# Patient Record
Sex: Female | Born: 1970
Health system: Southern US, Community
[De-identification: ages and names within clinical notes are randomized; demographics above are authoritative.]

## PROBLEM LIST (undated history)

## (undated) DIAGNOSIS — K219 Gastro-esophageal reflux disease without esophagitis: Secondary | ICD-10-CM

## (undated) HISTORY — DX: Gastro-esophageal reflux disease without esophagitis: K21.9

---

## 2004-01-09 ENCOUNTER — Ambulatory Visit: Payer: Self-pay | Admitting: Family Medicine

## 2016-01-20 DIAGNOSIS — Z0000 Encounter for general adult medical examination without abnormal findings: Secondary | ICD-10-CM | POA: Diagnosis not present

## 2016-01-20 DIAGNOSIS — D229 Melanocytic nevi, unspecified: Secondary | ICD-10-CM | POA: Diagnosis not present

## 2016-01-20 DIAGNOSIS — Z1389 Encounter for screening for other disorder: Secondary | ICD-10-CM | POA: Diagnosis not present

## 2016-01-20 DIAGNOSIS — M549 Dorsalgia, unspecified: Secondary | ICD-10-CM | POA: Diagnosis not present

## 2016-01-20 DIAGNOSIS — R51 Headache: Secondary | ICD-10-CM | POA: Diagnosis not present

## 2016-03-09 DIAGNOSIS — Z0000 Encounter for general adult medical examination without abnormal findings: Secondary | ICD-10-CM | POA: Diagnosis not present

## 2016-03-09 DIAGNOSIS — Z124 Encounter for screening for malignant neoplasm of cervix: Secondary | ICD-10-CM | POA: Diagnosis not present

## 2016-03-15 DIAGNOSIS — J111 Influenza due to unidentified influenza virus with other respiratory manifestations: Secondary | ICD-10-CM | POA: Diagnosis not present

## 2016-06-22 DIAGNOSIS — H1012 Acute atopic conjunctivitis, left eye: Secondary | ICD-10-CM | POA: Diagnosis not present

## 2016-06-22 DIAGNOSIS — L659 Nonscarring hair loss, unspecified: Secondary | ICD-10-CM | POA: Diagnosis not present

## 2016-06-22 DIAGNOSIS — M79671 Pain in right foot: Secondary | ICD-10-CM | POA: Diagnosis not present

## 2016-08-12 DIAGNOSIS — H5203 Hypermetropia, bilateral: Secondary | ICD-10-CM | POA: Diagnosis not present

## 2016-10-19 DIAGNOSIS — D239 Other benign neoplasm of skin, unspecified: Secondary | ICD-10-CM | POA: Diagnosis not present

## 2016-10-19 DIAGNOSIS — Z1389 Encounter for screening for other disorder: Secondary | ICD-10-CM | POA: Diagnosis not present

## 2016-11-11 DIAGNOSIS — L308 Other specified dermatitis: Secondary | ICD-10-CM | POA: Diagnosis not present

## 2016-11-11 DIAGNOSIS — D229 Melanocytic nevi, unspecified: Secondary | ICD-10-CM | POA: Diagnosis not present

## 2016-11-11 DIAGNOSIS — D239 Other benign neoplasm of skin, unspecified: Secondary | ICD-10-CM | POA: Diagnosis not present

## 2016-11-11 DIAGNOSIS — I8393 Asymptomatic varicose veins of bilateral lower extremities: Secondary | ICD-10-CM | POA: Diagnosis not present

## 2016-11-15 DIAGNOSIS — Z1389 Encounter for screening for other disorder: Secondary | ICD-10-CM | POA: Diagnosis not present

## 2016-11-15 DIAGNOSIS — N390 Urinary tract infection, site not specified: Secondary | ICD-10-CM | POA: Diagnosis not present

## 2016-12-08 ENCOUNTER — Ambulatory Visit (INDEPENDENT_AMBULATORY_CARE_PROVIDER_SITE_OTHER): Payer: BLUE CROSS/BLUE SHIELD | Admitting: Advanced Practice Midwife

## 2016-12-08 ENCOUNTER — Encounter: Payer: Self-pay | Admitting: Advanced Practice Midwife

## 2016-12-08 VITALS — BP 128/74 | HR 67 | Temp 99.5°F | Ht 63.0 in | Wt 138.0 lb

## 2016-12-08 DIAGNOSIS — R3915 Urgency of urination: Secondary | ICD-10-CM | POA: Diagnosis not present

## 2016-12-08 DIAGNOSIS — R319 Hematuria, unspecified: Secondary | ICD-10-CM

## 2016-12-08 DIAGNOSIS — R309 Painful micturition, unspecified: Secondary | ICD-10-CM | POA: Diagnosis not present

## 2016-12-08 LAB — POCT URINALYSIS DIPSTICK
Bilirubin, UA: NEGATIVE
GLUCOSE UA: NEGATIVE
Ketones, UA: NEGATIVE
Leukocytes, UA: NEGATIVE
Nitrite, UA: NEGATIVE
SPEC GRAV UA: 1.01 (ref 1.010–1.025)
UROBILINOGEN UA: NEGATIVE U/dL — AB
pH, UA: 6 (ref 5.0–8.0)

## 2016-12-08 NOTE — Progress Notes (Signed)
S: The patient is here today with complaint of urinary urgency and low pelvic pain after she pees. She began having symptoms about 1 month ago. She was seen by another provider and was prescribed some medicine but she does not know what the medicine was. She took it for 3 days and did not feel relief. She then took it for 3 more days. There was some connection to her cycle but that part of the history is unclear. She is having a monthly cycle with some irregularity noted in length of cycles. About 3 days ago the urgency of urination increased. She is able to fully empty her bladder. She denies any itching, discharge or irritation. She has not had intercourse for about 1 month because she is afraid of the pain she has had. She is also complaining of pain in her lower back especially when lifting something heavy, and pain/heat in her right ankle. She denies flank pain.  Discussion of hormonal changes as she approaches menopause, pelvic floor muscle changes, vaginal dryness, pain with intercourse and using lubricant for comfort.  Discussion of sciatica nerve pain, comfort measures and type of doctor to see if the pain worsens.  Discussion of UTI s/s and that UA appears normal but we will send specimen for culture and I will let her know the results prior to starting any antibiotics. She can take tylenol or ibuprofen for pain.   O: Vital Signs: BP 128/74 (BP Location: Left Arm, Patient Position: Sitting, Cuff Size: Normal)   Pulse 67   Temp 99.5 F (37.5 C)   Ht 5\' 3"  (1.6 m)   Wt 138 lb (62.6 kg)   LMP 11/17/2016   BMI 24.45 kg/m  Constitutional: Well nourished, well developed female in no acute distress.  HEENT: normal Skin: Warm and dry.  Cardiovascular: Regular rate and rhythm.   Extremity: no edema Respiratory: Clear to auscultation bilateral. Normal respiratory effort Abdomen: soft, nondistended, no abnormal masses, no epigastric pain, mildly tender to palpation lower abdomen Back: no CVAT,  non-tender to palpation Neuro: DTRs 2+, Cranial nerves grossly intact Psych: Alert and Oriented x3. No memory deficits. Normal mood and affect.  MS: normal gait, normal bilateral lower extremity ROM/strength/stability.  Results for Tanya Sawyer, Tanya Sawyer (MRN [id number]) as of 12/08/2016 11:08  Ref. Range 12/08/2016 10:26  Bilirubin, UA Unknown Neg  Clarity, UA Unknown Clear  Color, UA Unknown Gold  Glucose Unknown Neg  Ketones, UA Unknown Neg  Leukocytes, UA Latest Ref Range: Negative  Negative  Nitrite, UA Unknown Neg  pH, UA Latest Ref Range: 5.0 - 8.0  6.0  Protein, UA Unknown 1+  Specific Gravity, UA Latest Ref Range: 1.010 - 1.025  1.010  Urobilinogen, UA Latest Ref Range: 0.2 or 1.0 E.U./dL negative (A)  RBC, UA Unknown Large    Pelvic exam:  is not limited by body habitus EGBUS: within normal limits Vagina: within normal limits and with normal mucosa, no evidence of organ prolapse Cervix: appears normal  A: 46 yo female with urinary urgency, pain with urination x 3 days, low back pain, right ankle pain  P: Urine Cx sent Recommendation for follow up care with chiropractor, orthopedic doctor as needed, comfort measures for back pain Comfort measures for perimenopausal hormonal changes  Tresea MallJane Xaine Sansom, CNM

## 2016-12-10 LAB — URINE CULTURE: Organism ID, Bacteria: NO GROWTH

## 2017-09-14 ENCOUNTER — Encounter: Payer: Self-pay | Admitting: Family Medicine

## 2017-09-14 ENCOUNTER — Ambulatory Visit (INDEPENDENT_AMBULATORY_CARE_PROVIDER_SITE_OTHER): Payer: BLUE CROSS/BLUE SHIELD | Admitting: Family Medicine

## 2017-09-14 VITALS — BP 114/72 | HR 84 | Temp 98.9°F | Resp 16 | Ht 61.0 in | Wt 138.6 lb

## 2017-09-14 DIAGNOSIS — K219 Gastro-esophageal reflux disease without esophagitis: Secondary | ICD-10-CM | POA: Insufficient documentation

## 2017-09-14 DIAGNOSIS — I83899 Varicose veins of unspecified lower extremities with other complications: Secondary | ICD-10-CM | POA: Diagnosis not present

## 2017-09-14 DIAGNOSIS — Z1239 Encounter for other screening for malignant neoplasm of breast: Secondary | ICD-10-CM

## 2017-09-14 DIAGNOSIS — Z1231 Encounter for screening mammogram for malignant neoplasm of breast: Secondary | ICD-10-CM

## 2017-09-14 DIAGNOSIS — Z23 Encounter for immunization: Secondary | ICD-10-CM | POA: Diagnosis not present

## 2017-09-14 DIAGNOSIS — F341 Dysthymic disorder: Secondary | ICD-10-CM

## 2017-09-14 DIAGNOSIS — M2352 Chronic instability of knee, left knee: Secondary | ICD-10-CM

## 2017-09-14 NOTE — Patient Instructions (Addendum)
Gastroesophageal Reflux Disease, Adult Normally, food travels down the esophagus and stays in the stomach to be digested. If a person has gastroesophageal reflux disease (GERD), food and stomach acid move back up into the esophagus. When this happens, the esophagus becomes sore and swollen (inflamed). Over time, GERD can make small holes (ulcers) in the lining of the esophagus. Follow these instructions at home: Diet  Follow a diet as told by your doctor. You may need to avoid foods and drinks such as: ? Coffee and tea (with or without caffeine). ? Drinks that contain alcohol. ? Energy drinks and sports drinks. ? Carbonated drinks or sodas. ? Chocolate and cocoa. ? Peppermint and mint flavorings. ? Garlic and onions. ? Horseradish. ? Spicy and acidic foods, such as peppers, chili powder, curry powder, vinegar, hot sauces, and BBQ sauce. ? Citrus fruit juices and citrus fruits, such as oranges, lemons, and limes. ? Tomato-based foods, such as red sauce, chili, salsa, and pizza with red sauce. ? Fried and fatty foods, such as donuts, french fries, potato chips, and high-fat dressings. ? High-fat meats, such as hot dogs, rib eye steak, sausage, ham, and bacon. ? High-fat dairy items, such as whole milk, butter, and cream cheese.  Eat small meals often. Avoid eating large meals.  Avoid drinking large amounts of liquid with your meals.  Avoid eating meals during the 2-3 hours before bedtime.  Avoid lying down right after you eat.  Do not exercise right after you eat. General instructions  Pay attention to any changes in your symptoms.  Take over-the-counter and prescription medicines only as told by your doctor. Do not take aspirin, ibuprofen, or other NSAIDs unless your doctor says it is okay.  Do not use any tobacco products, including cigarettes, chewing tobacco, and e-cigarettes. If you need help quitting, ask your doctor.  Wear loose clothes. Do not wear anything tight around  your waist.  Raise (elevate) the head of your bed about 6 inches (15 cm).  Try to lower your stress. If you need help doing this, ask your doctor.  If you are overweight, lose an amount of weight that is healthy for you. Ask your doctor about a safe weight loss goal.  Keep all follow-up visits as told by your doctor. This is important. Contact a doctor if:  You have new symptoms.  You lose weight and you do not know why it is happening.  You have trouble swallowing, or it hurts to swallow.  You have wheezing or a cough that keeps happening.  Your symptoms do not get better with treatment.  You have a hoarse voice. Get help right away if:  You have pain in your arms, neck, jaw, teeth, or back.  You feel sweaty, dizzy, or light-headed.  You have chest pain or shortness of breath.  You throw up (vomit) and your throw up looks like blood or coffee grounds.  You pass out (faint).  Your poop (stool) is bloody or black.  You cannot swallow, drink, or eat. This information is not intended to replace advice given to you by your health care provider. Make sure you discuss any questions you have with your health care provider. Document Released: 07/06/2007 Document Revised: 06/25/2015 Document Reviewed: 05/14/2014 Elsevier Interactive Patient Education  2018 [employer]  Knee Rehabilitation Guidelines Following Surgery After knee surgery, it is important to follow instructions from your health care provider about range-of-motion (ROM) and muscle strengthening exercises. This will improve your surgery results. If the exercises cause  you to have pain or swelling in your knee joint, do them less often until you can do them without pain. Then, slowly increase how often you do your exercises. If you have problems or questions, talk with your health care provider or physical therapist. You should start exercising as soon as your health care provider or physical therapist says it is  okay. Follow these instructions at home: Activity  Use your crutches orwalker as told by your health care provider.  Do not lift anything that is heavier than 10 lb (4.5 kg) and do not play contact sports until your health care provider says it is okay.  Return to your normal activities as told by your health care provider. Ask your health care provider what activities are safe for you.  Return to work as told by your health care provider.  Do not drive a car for six weeks or as told by your health care provider. General instructions  Take over-the-counter and prescription medicines only as told by your health care provider.  Protect your knee during the recovery period to keep it from getting injured again.  You may take sponge baths. Do not take showers or tub baths until your health care provider says it is okay.  Remove throw rugs and tripping hazards from the floor.  Wear elastic stockings for as long as your health care provider instructs you to.  Keep all follow-up visits as told by your health care provider. This is important. Range of motion and strengthening exercises Do your exercises as told by your health care provider or physical therapist. Before you exercise  Put a towel between your thigh and a heat pack or heating pad.  Leave the heat on your thigh muscle for 20-30 minutes before you exercise. Leg lifts While your knee is still in a splint or a cast, you can do straight-leg raises. Repeat this exercise 10-20 times, 2-3 times per day. As your knee gets better, do this exercise against resistance. 1. Lie flat on your back. 2. Lift the leg about 6 inches. Keep it raised for 3 seconds. 3. Slowly lower the leg.  Quad sets Repeat this exercise 10-20 times every hour. 1. Lie flat on your back. 2. Tighten your thigh muscle (quad). 3. Keep the muscle tight for 5-10 seconds.  Hamstring sets Repeat this exercise 10-20 times every hour. 1. Push your foot backward  against an object that does not move. 2. Keep pushing your foot against it for 5-10 seconds.  Weight-resistance exercises Weight-resistance exercises are another important part of rehabilitation. These exercises strengthen your muscles by making them work against resistance. Examples include using:  Free weights.  Weight-lifting machines.  Resistance bands.  Aerobic exercises Aerobic exercise keeps joints and muscles moving. It involves large muscle groups. It is also rhythmic in nature and is done for a longer period. Doing these exercises improves circulation and endurance. Your health care provider may have you start by taking a 20-30 minute walk, 2 times per day. Examples of aerobic exercise include:  Swimming.  Walking.  Hiking.  Jogging.  Cross-country skiing.  Bike riding.  This information is not intended to replace advice given to you by your health care provider. Make sure you discuss any questions you have with your health care provider. Document Released: 01/17/2005 Document Revised: 09/22/2015 Document Reviewed: 01/13/2014 Elsevier Interactive Patient Education  [employer]2018 [employer]

## 2017-09-14 NOTE — Progress Notes (Signed)
Name: Tanya Sawyer   MRN: [id number]    DOB: 1970-07-30   Date:09/14/2017       Progress Note  Subjective  Chief Complaint  Chief Complaint  Patient presents with  . Establish Care  . Knee Pain    Left Knee is bothering her and feels like it is giving out on her.    HPI  GERD: she states she has recurrent epigastric pain, burning like , aggravated by spicy food and better with a bland diet. Not associated with nausea or vomiting  Spider Vein: she states her legs aches at the end of the day, also has some itching and burning on her legs at times and would like to be checked.   Left knee instability: she states that over the past month she has noticed that left knee seems to give out. No falls, it happens a few times a week, no effusion of knee and pain is mild and aching when present. She had a fall but the pain caused by the fall was on the right knee and it happened about 2 years go.   Depression: she has a long history of depression, never affected her life or ability to function, she never took medication, she states when she feels down , she finds something to do and she feels better. Denies suicidal thoughts or ideation  Patient Active Problem List   Diagnosis Date Noted  . Symptomatic spider varicose vein 09/14/2017  . GERD (gastroesophageal reflux disease) 09/14/2017    Past Surgical History:  Procedure Laterality Date  . CESAREAN SECTION  1994, 1998    Family History  Problem Relation Age of Onset  . Neuropathy Father   . Hip fracture Paternal Grandmother   . Breast cancer Paternal Aunt     Social History   Socioeconomic History  . Marital status: Married    Spouse name: Gulio  . Number of children: 2  . Years of education: Not on file  . Highest education level: High school graduate  Occupational History  . Not on file  Social Needs  . Financial resource strain: Not hard at all  . Food insecurity:    Worry: Never true    Inability: Never true  .  Transportation needs:    Medical: No    Non-medical: No  Tobacco Use  . Smoking status: Never Smoker  . Smokeless tobacco: Never Used  Substance and Sexual Activity  . Alcohol use: No    Frequency: Never  . Drug use: No  . Sexual activity: Yes    Partners: Male    Birth control/protection: None  Lifestyle  . Physical activity:    Days per week: 0 days    Minutes per session: 0 min  . Stress: Only a little  Relationships  . Social connections:    Talks on phone: Not on file    Gets together: Not on file    Attends religious service: Not on file    Active member of club or organization: Not on file    Attends meetings of clubs or organizations: Not on file    Relationship status: Not on file  . Intimate partner violence:    Fear of current or ex partner: No    Emotionally abused: No    Physically abused: No    Forced sexual activity: No  Other Topics Concern  . Not on file  Social History Narrative   Married, she has two grown children that are still at home but  they work   She is originally from [place], she moved here at age 36 by herself, for a better life She met her husband here, he is also from [place]      Current Outpatient Medications:  Marland Kitchen  Multiple Vitamins-Minerals (MULTI-VITAMIN GUMMIES) CHEW, Chew 1 each by mouth daily. Olly Gummy Multi-Vitamins, Disp: , Rfl:  .  triamcinolone ointment (KENALOG) 0.1 %, , Disp: , Rfl:   Allergies  Allergen Reactions  . Penicillins     As a child     ROS  Constitutional: Negative for fever or weight change.  Respiratory: Negative for cough and shortness of breath.   Cardiovascular: Negative for chest pain or palpitations.  Gastrointestinal: Negative for abdominal pain, no bowel changes.  Musculoskeletal: Positive for intermittent  gait problem, left knee instability  or joint swelling.  Skin: Negative for rash.  Neurological: Negative for dizziness or headache.  No other specific complaints in a complete  review of systems (except as listed in HPI above).  Objective  Vitals:   09/14/17 1039  BP: 114/72  Pulse: 84  Resp: 16  Temp: 98.9 F (37.2 C)  TempSrc: Oral  SpO2: 98%  Weight: 138 lb 9.6 oz (62.9 kg)  Height: 5' 1" (1.549 m)    Body mass index is 26.19 kg/m.  Physical Exam  Constitutional: Patient appears well-developed and well-nourished. Overweight. No distress.  HEENT: head atraumatic, normocephalic, pupils equal and reactive to light,  neck supple, throat within normal limits Cardiovascular: Normal rate, regular rhythm and normal heart sounds.  No murmur heard. No BLE edema. She has spider veins both legs  Muscular skeletal: crepitus with extension of both knees, no effusion, normal rom  Pulmonary/Chest: Effort normal and breath sounds normal. No respiratory distress. Abdominal: Soft.  There is no tenderness. Psychiatric: Patient has a normal mood and affect. behavior is normal. Judgment and thought content normal.  PHQ2/9: Depression screen PHQ 2/9 09/14/2017  Decreased Interest 2  Down, Depressed, Hopeless 2  PHQ - 2 Score 4  Altered sleeping 1  Tired, decreased energy 1  Change in appetite 0  Feeling bad or failure about yourself  1  Trouble concentrating 2  Moving slowly or fidgety/restless 0  Suicidal thoughts 0  PHQ-9 Score 9  Difficult doing work/chores Not difficult at all   GAD 7 : Generalized Anxiety Score 09/14/2017  Nervous, Anxious, on Edge 1  Control/stop worrying 0  Worry too much - different things 1  Trouble relaxing 0  Restless 0  Easily annoyed or irritable 1    Fall Risk: Fall Risk  09/14/2017  Falls in the past year? No    Functional Status Survey: Is the patient deaf or have difficulty hearing?: No Does the patient have difficulty seeing, even when wearing glasses/contacts?: Yes(reading glasses) Does the patient have difficulty concentrating, remembering, or making decisions?: No Does the patient have difficulty walking or  climbing stairs?: No Does the patient have difficulty dressing or bathing?: No Does the patient have difficulty doing errands alone such as visiting a doctor's office or shopping?: No   Assessment & Plan  1. Symptomatic spider varicose vein  - Ambulatory referral to Vascular Surgery  2. Gastroesophageal reflux disease without esophagitis  Discussed life style modification and take otc medication   3. Need for Tdap vaccination  - Tdap vaccine greater than or equal to 7yo IM  4. Breast cancer screening  - MM DIGITAL SCREENING BILATERAL; Future  5. Recurrent left knee instability  Discussed  home strength exercise   6. Dysthymia  She seems to be coping well.

## 2017-10-26 ENCOUNTER — Encounter: Payer: Self-pay | Admitting: Family Medicine

## 2017-10-26 ENCOUNTER — Ambulatory Visit (INDEPENDENT_AMBULATORY_CARE_PROVIDER_SITE_OTHER): Payer: BLUE CROSS/BLUE SHIELD | Admitting: Family Medicine

## 2017-10-26 ENCOUNTER — Other Ambulatory Visit (HOSPITAL_COMMUNITY)
Admission: RE | Admit: 2017-10-26 | Discharge: 2017-10-26 | Disposition: A | Discharge status: Home / Self Care | Payer: BLUE CROSS/BLUE SHIELD | Source: Ambulatory Visit | Attending: Family Medicine | Admitting: Family Medicine

## 2017-10-26 VITALS — BP 124/76 | HR 74 | Temp 98.2°F | Resp 16 | Ht 61.0 in | Wt 140.4 lb

## 2017-10-26 DIAGNOSIS — Z01419 Encounter for gynecological examination (general) (routine) without abnormal findings: Secondary | ICD-10-CM | POA: Diagnosis not present

## 2017-10-26 DIAGNOSIS — Z124 Encounter for screening for malignant neoplasm of cervix: Secondary | ICD-10-CM | POA: Insufficient documentation

## 2017-10-26 DIAGNOSIS — Z23 Encounter for immunization: Secondary | ICD-10-CM

## 2017-10-26 DIAGNOSIS — Z13220 Encounter for screening for lipoid disorders: Secondary | ICD-10-CM | POA: Diagnosis not present

## 2017-10-26 DIAGNOSIS — Z131 Encounter for screening for diabetes mellitus: Secondary | ICD-10-CM | POA: Diagnosis not present

## 2017-10-26 NOTE — Progress Notes (Signed)
Name: Tanya Sawyer   MRN: [id number]    DOB: [birth year]   Date:10/26/2017       Progress Note  Subjective  Chief Complaint  Chief Complaint  Patient presents with  . Annual Exam    HPI   Patient presents for annual CPE      Office Visit from 09/14/2017 in [facility]  AUDIT-C Score  0     Depression:  Depression screen [facility] 2/9 10/26/2017 09/14/2017  Decreased Interest 0 2  Down, Depressed, Hopeless 0 2  PHQ - 2 Score 0 4  Altered sleeping 1 1  Tired, decreased energy 1 1  Change in appetite 0 0  Feeling bad or failure about yourself  0 1  Trouble concentrating 0 2  Moving slowly or fidgety/restless 0 0  Suicidal thoughts 0 0  PHQ-9 Score 2 9  Difficult doing work/chores Not difficult at all Not difficult at all   Hypertension: BP Readings from Last 3 Encounters:  10/26/17 124/76  09/14/17 114/72  12/08/16 128/74   Obesity: Wt Readings from Last 3 Encounters:  10/26/17 140 lb 6.4 oz (63.7 kg)  09/14/17 138 lb 9.6 oz (62.9 kg)  12/08/16 138 lb (62.6 kg)   BMI Readings from Last 3 Encounters:  10/26/17 26.53 kg/m  09/14/17 26.19 kg/m  12/08/16 24.45 kg/m     STD testing and prevention (HIV/chl/gon/syphilis): N/A Intimate partner violence: negative screen  Sexual History/Pain during Intercourse: no pain during the sex Menstrual History/LMP/Abnormal Bleeding: Incontinence Symptoms: she states urinary urgency improved   Advanced Care Planning: A voluntary discussion about advance care planning including the explanation and discussion of advance directives.  Discussed health care proxy and Living will, and the patient was able to identify a health care proxy as husband  Patient does not have a living will at present time.  Breast cancer: advised her to schedule it  BRCA gene screening: N/A Cervical cancer screening: today    Skin cancer: discussed atypical lesions Colorectal cancer: start at age 39 yo   Patient Active Problem List    Diagnosis Date Noted  . Symptomatic spider varicose vein 09/14/2017  . GERD (gastroesophageal reflux disease) 09/14/2017    Past Surgical History:  Procedure Laterality Date  . CESAREAN SECTION  1994, 1998    Family History  Problem Relation Age of Onset  . Neuropathy Father   . Hip fracture Paternal Grandmother   . Breast cancer Paternal Aunt     Social History   Socioeconomic History  . Marital status: Married    Spouse name: Gulio  . Number of children: 2  . Years of education: Not on file  . Highest education level: High school graduate  Occupational History  . Not on file  Social Needs  . Financial resource strain: Not hard at all  . Food insecurity:    Worry: Never true    Inability: Never true  . Transportation needs:    Medical: No    Non-medical: No  Tobacco Use  . Smoking status: Never Smoker  . Smokeless tobacco: Never Used  Substance and Sexual Activity  . Alcohol use: No    Frequency: Never  . Drug use: No  . Sexual activity: Yes    Partners: Male    Birth control/protection: None  Lifestyle  . Physical activity:    Days per week: 0 days    Minutes per session: 0 min  . Stress: Only a little  Relationships  . Social connections:  Talks on phone: More than three times a week    Gets together: More than three times a week    Attends religious service: 1 to 4 times per year    Active member of club or organization: No    Attends meetings of clubs or organizations: Never    Relationship status: Married  . Intimate partner violence:    Fear of current or ex partner: No    Emotionally abused: No    Physically abused: No    Forced sexual activity: No  Other Topics Concern  . Not on file  Social History Narrative   Married, she has two grown children that are still at home but they work   She is originally from [place], she moved here at age 44 by herself, for a better life She met her husband here, he is also from [place]. 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 Olly Gummy Multi-Vitamins, Disp: , Rfl:  .  triamcinolone ointment (KENALOG) 0.1 %, , Disp: , Rfl:   Allergies  Allergen Reactions  . Penicillins     As a child     ROS  Constitutional: Negative for fever or weight change.  Respiratory: Negative for cough and shortness of breath.   Cardiovascular: Negative for chest pain or palpitations.  Gastrointestinal: Negative for abdominal pain, no bowel changes.  Musculoskeletal: Negative for gait problem or joint swelling.  Skin: Negative for rash.  Neurological: Negative for dizziness or headache.  No other specific complaints in a complete review of systems (except as listed in HPI above).   Objective  Vitals:   10/26/17 0940  BP: 124/76  Pulse: 74  Resp: 16  Temp: 98.2 F (36.8 C)  TempSrc: Oral  SpO2: 99%  Weight: 140 lb 6.4 oz (63.7 kg)  Height: 5' 1"  (1.549 m)    Body mass index is 26.53 kg/m.  Physical Exam  Constitutional: Patient appears well-developed and well-nourished. No distress.  HENT: Head: Normocephalic and atraumatic. Ears: B TMs ok, no erythema or effusion; Nose: Nose normal. Mouth/Throat: Oropharynx is clear and moist. No oropharyngeal exudate.  Eyes: Conjunctivae and EOM are normal. Pupils are equal, round, and reactive to light. No scleral icterus.  Neck: Normal range of motion. Neck supple. No JVD present. No thyromegaly present.  Cardiovascular: Normal rate, regular rhythm and normal heart sounds.  No murmur heard. No BLE edema. Pulmonary/Chest: Effort normal and breath sounds normal. No respiratory distress. Abdominal: Soft. Bowel sounds are normal, no distension. There is no tenderness. no masses Breast: no lumps or masses, no nipple discharge or rashes FEMALE GENITALIA:  External genitalia normal External urethra  normal Vaginal vault normal without discharge or lesions Cervix normal without discharge or lesions Bimanual exam normal without masses RECTAL: normal external exam  Musculoskeletal: Normal range of motion, no joint effusions. No gross deformities Neurological: he is alert and oriented to person, place, and time. No cranial nerve deficit. Coordination, balance, strength, speech and gait are normal.  Skin: Skin is warm and dry. No rash noted. No erythema.  Psychiatric: Patient has a normal mood and affect. behavior is normal. Judgment and thought content normal.  PHQ2/9: Depression screen [facility] 2/9 10/26/2017 09/14/2017  Decreased Interest 0 2  Down, Depressed, Hopeless 0 2  PHQ - 2 Score 0 4  Altered sleeping 1 1  Tired, decreased energy 1 1  Change in appetite 0 0  Feeling bad or failure about yourself  0 1  Trouble concentrating 0 2  Moving slowly or fidgety/restless 0 0  Suicidal thoughts 0 0  PHQ-9 Score 2 9  Difficult doing work/chores Not difficult at all Not difficult at all     Fall Risk: Fall Risk  10/26/2017 09/14/2017  Falls in the past year? No No     Assessment & Plan  1. Well woman exam  - COMPLETE METABOLIC PANEL WITH GFR - Hemoglobin A1c - Lipid panel  2. Need for immunization against influenza  refused  3. Cervical cancer screening  - Cytology - PAP  4. Lipid screening  - Lipid panel  5. Diabetes mellitus screening  - Hemoglobin A1c   -USPSTF grade A and B recommendations reviewed with patient; age-appropriate recommendations, preventive care, screening tests, etc discussed and encouraged; healthy living encouraged; see AVS for patient education given to patient -Discussed importance of 150 minutes of physical activity weekly, eat two servings of fish weekly, eat one serving of tree nuts ( cashews, pistachios, pecans, almonds.Marland Kitchen) every other day, eat 6 servings of fruit/vegetables daily and drink plenty of water and avoid sweet beverages.

## 2017-10-26 NOTE — Patient Instructions (Signed)
Preventive Care 40-64 Years, Female Preventive care refers to lifestyle choices and visits with your health care provider that can promote health and wellness. What does preventive care include?  A yearly physical exam. This is also called an annual well check.  Dental exams once or twice a year.  Routine eye exams. Ask your health care provider how often you should have your eyes checked.  Personal lifestyle choices, including: ? Daily care of your teeth and gums. ? Regular physical activity. ? Eating a healthy diet. ? Avoiding tobacco and drug use. ? Limiting alcohol use. ? Practicing safe sex. ? Taking low-dose aspirin daily starting at age 58. ? Taking vitamin and mineral supplements as recommended by your health care provider. What happens during an annual well check? The services and screenings done by your health care provider during your annual well check will depend on your age, overall health, lifestyle risk factors, and family history of disease. Counseling Your health care provider may ask you questions about your:  Alcohol use.  Tobacco use.  Drug use.  Emotional well-being.  Home and relationship well-being.  Sexual activity.  Eating habits.  Work and work Statistician.  Method of birth control.  Menstrual cycle.  Pregnancy history.  Screening You may have the following tests or measurements:  Height, weight, and BMI.  Blood pressure.  Lipid and cholesterol levels. These may be checked every 5 years, or more frequently if you are over 81 years old.  Skin check.  Lung cancer screening. You may have this screening every year starting at age 78 if you have a 30-pack-year history of smoking and currently smoke or have quit within the past 15 years.  Fecal occult blood test (FOBT) of the stool. You may have this test every year starting at age 65.  Flexible sigmoidoscopy or colonoscopy. You may have a sigmoidoscopy every 5 years or a colonoscopy  every 10 years starting at age 30.  Hepatitis C blood test.  Hepatitis B blood test.  Sexually transmitted disease (STD) testing.  Diabetes screening. This is done by checking your blood sugar (glucose) after you have not eaten for a while (fasting). You may have this done every 1-3 years.  Mammogram. This may be done every 1-2 years. Talk to your health care provider about when you should start having regular mammograms. This may depend on whether you have a family history of breast cancer.  BRCA-related cancer screening. This may be done if you have a family history of breast, ovarian, tubal, or peritoneal cancers.  Pelvic exam and Pap test. This may be done every 3 years starting at age 80. Starting at age 36, this may be done every 5 years if you have a Pap test in combination with an HPV test.  Bone density scan. This is done to screen for osteoporosis. You may have this scan if you are at high risk for osteoporosis.  Discuss your test results, treatment options, and if necessary, the need for more tests with your health care provider. Vaccines Your health care provider may recommend certain vaccines, such as:  Influenza vaccine. This is recommended every year.  Tetanus, diphtheria, and acellular pertussis (Tdap, Td) vaccine. You may need a Td booster every 10 years.  Varicella vaccine. You may need this if you have not been vaccinated.  Zoster vaccine. You may need this after age 5.  Measles, mumps, and rubella (MMR) vaccine. You may need at least one dose of MMR if you were born in  1957 or later. You may also need a second dose.  Pneumococcal 13-valent conjugate (PCV13) vaccine. You may need this if you have certain conditions and were not previously vaccinated.  Pneumococcal polysaccharide (PPSV23) vaccine. You may need one or two doses if you smoke cigarettes or if you have certain conditions.  Meningococcal vaccine. You may need this if you have certain  conditions.  Hepatitis A vaccine. You may need this if you have certain conditions or if you travel or work in places where you may be exposed to hepatitis A.  Hepatitis B vaccine. You may need this if you have certain conditions or if you travel or work in places where you may be exposed to hepatitis B.  Haemophilus influenzae type b (Hib) vaccine. You may need this if you have certain conditions.  Talk to your health care provider about which screenings and vaccines you need and how often you need them. This information is not intended to replace advice given to you by your health care provider. Make sure you discuss any questions you have with your health care provider. Document Released: 02/13/2015 Document Revised: 10/07/2015 Document Reviewed: 11/18/2014 Elsevier Interactive Patient Education  2018 Elsevier Inc.  

## 2017-10-27 LAB — COMPLETE METABOLIC PANEL WITH GFR
AG Ratio: 1.5 (calc) (ref 1.0–2.5)
ALT: 17 U/L (ref 6–29)
AST: 19 U/L (ref 10–35)
Albumin: 4.3 g/dL (ref 3.6–5.1)
Alkaline phosphatase (APISO): 69 U/L (ref 33–115)
BILIRUBIN TOTAL: 0.3 mg/dL (ref 0.2–1.2)
BUN: 11 mg/dL (ref 7–25)
CHLORIDE: 105 mmol/L (ref 98–110)
CO2: 27 mmol/L (ref 20–32)
Calcium: 9.3 mg/dL (ref 8.6–10.2)
Creat: 0.6 mg/dL (ref 0.50–1.10)
GFR, EST AFRICAN AMERICAN: 125 mL/min/{1.73_m2} (ref >=60)
GFR, EST NON AFRICAN AMERICAN: 108 mL/min/{1.73_m2} (ref >=60)
GLUCOSE: 85 mg/dL (ref 65–99)
Globulin: 2.9 g/dL (calc) (ref 1.9–3.7)
POTASSIUM: 4.4 mmol/L (ref 3.5–5.3)
SODIUM: 138 mmol/L (ref 135–146)
Total Protein: 7.2 g/dL (ref 6.1–8.1)

## 2017-10-27 LAB — HEMOGLOBIN A1C
Hgb A1c MFr Bld: 5.6 % of total Hgb (ref <5.7)
MEAN PLASMA GLUCOSE: 114 (calc)
eAG (mmol/L): 6.3 (calc)

## 2017-10-27 LAB — LIPID PANEL
Cholesterol: 182 mg/dL (ref <200)
HDL: 61 mg/dL (ref >50)
LDL Cholesterol (Calc): 103 mg/dL (calc) — ABNORMAL HIGH
NON-HDL CHOLESTEROL (CALC): 121 mg/dL (ref <130)
TRIGLYCERIDES: 90 mg/dL (ref <150)
Total CHOL/HDL Ratio: 3 (calc) (ref <5.0)

## 2017-10-27 LAB — CYTOLOGY - PAP
Diagnosis: NEGATIVE
HPV (WINDOPATH): NOT DETECTED

## 2018-02-27 ENCOUNTER — Telehealth: Payer: Self-pay | Admitting: Family Medicine

## 2018-02-27 NOTE — Telephone Encounter (Signed)
Pt is requesting that you place an order for a mammogram to [facility]

## 2018-02-27 NOTE — Telephone Encounter (Signed)
appt scheduled for tomorrow

## 2018-02-28 ENCOUNTER — Encounter: Payer: Self-pay | Admitting: Family Medicine

## 2018-02-28 ENCOUNTER — Ambulatory Visit (INDEPENDENT_AMBULATORY_CARE_PROVIDER_SITE_OTHER): Payer: BLUE CROSS/BLUE SHIELD | Admitting: Family Medicine

## 2018-02-28 VITALS — BP 120/70 | HR 85 | Temp 97.7°F | Resp 16 | Ht 61.0 in | Wt 134.9 lb

## 2018-02-28 DIAGNOSIS — N6310 Unspecified lump in the right breast, unspecified quadrant: Secondary | ICD-10-CM

## 2018-02-28 NOTE — Progress Notes (Signed)
Name: Tanya Sawyer   MRN: [id number]    DOB: [birth year]   Date:02/28/2018       Progress Note  Subjective  Chief Complaint  Chief Complaint  Patient presents with  . Breast Mass    right breast mass. She noticed it yesterday while she was in the shower. She denies pain.    HPI  Right breast mass: she was doing self breast exam and felt a lump on right breast yesterday am, it is tender to touch, no redness, no nipple discharge. She states one of her paternal aunts had breast cancer in her late 51's. No other family history. She denies any previous history of breast lumps. Last mammogram was done 07/2015. She has a history of mastalgia around her cycles. She has breast cysts seen on US done in 2017    Patient Active Problem List   Diagnosis Date Noted  . Symptomatic spider varicose vein 09/14/2017  . GERD (gastroesophageal reflux disease) 09/14/2017    Past Surgical History:  Procedure Laterality Date  . CESAREAN SECTION  1994, 1998    Family History  Problem Relation Age of Onset  . Neuropathy Father   . Hip fracture Paternal Grandmother   . Breast cancer Paternal Aunt     Social History   Socioeconomic History  . Marital status: Married    Spouse name: Gulio  . Number of children: 2  . Years of education: Not on file  . Highest education level: High school graduate  Occupational History  . Not on file  Social Needs  . Financial resource strain: Not hard at all  . Food insecurity:    Worry: Never true    Inability: Never true  . Transportation needs:    Medical: No    Non-medical: No  Tobacco Use  . Smoking status: Never Smoker  . Smokeless tobacco: Never Used  Substance and Sexual Activity  . Alcohol use: No    Frequency: Never  . Drug use: No  . Sexual activity: Yes    Partners: Male    Birth control/protection: None  Lifestyle  . Physical activity:    Days per week: 0 days    Minutes per session: 0 min  . Stress: Only a little  Relationships  .  Social connections:    Talks on phone: More than three times a week    Gets together: More than three times a week    Attends religious service: 1 to 4 times per year    Active member of club or organization: No    Attends meetings of clubs or organizations: Never    Relationship status: Married  . Intimate partner violence:    Fear of current or ex partner: No    Emotionally abused: No    Physically abused: No    Forced sexual activity: No  Other Topics Concern  . Not on file  Social History Narrative   Married, she has two grown children that are still at home but they work   She is originally from [place], she moved here at age 52 by herself, for a better life She met her husband here, he is also from [place]. 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 Olly Gummy Multi-Vitamins, Disp: , Rfl:  .  triamcinolone ointment (KENALOG) 0.1 %, , Disp: , Rfl:  Allergies  Allergen Reactions  . Penicillins     As a child    I personally reviewed active problem list, medication list, allergies, family history, social history with the patient/caregiver today.   ROS  Constitutional: Negative for fever or weight change.  Respiratory: Negative for cough and shortness of breath.   Cardiovascular: Negative for chest pain or palpitations.  Gastrointestinal: Negative for abdominal pain, no bowel changes.  Musculoskeletal: Negative for gait problem or joint swelling.  Skin: Negative for rash.  Neurological: Negative for dizziness or headache.  No other specific complaints in a complete review of systems (except as listed in HPI above).  Objective  Vitals:   02/28/18 0951  BP: 120/70  Pulse: 85  Resp: 16  Temp: 97.7 F (36.5 C)  TempSrc: Oral  SpO2: 100%  Weight: 134 lb 14.4 oz (61.2 kg)  Height: 5' 1"  (1.549 m)    Body mass index is  25.49 kg/m.  Physical Exam  Constitutional: Patient appears well-developed and well-nourished. Overweight.  No distress.  HEENT: head atraumatic, normocephalic, pupils equal and reactive to light,  neck supple, throat within normal limits Cardiovascular: Normal rate, regular rhythm and normal heart sounds.  No murmur heard. No BLE edema. Pulmonary/Chest: Effort normal and breath sounds normal. No respiratory distress. Abdominal: Soft.  There is no tenderness. Breast: large breast mass at 1 o'clock, no nipple discharge, slightly firm, but not attached to deeper tissues Psychiatric: Patient has a normal mood and affect. behavior is normal. Judgment and thought content normal.  PHQ2/9: Depression screen [facility] 2/9 02/28/2018 10/26/2017 09/14/2017  Decreased Interest 0 0 2  Down, Depressed, Hopeless 0 0 2  PHQ - 2 Score 0 0 4  Altered sleeping - 1 1  Tired, decreased energy - 1 1  Change in appetite - 0 0  Feeling bad or failure about yourself  - 0 1  Trouble concentrating - 0 2  Moving slowly or fidgety/restless - 0 0  Suicidal thoughts - 0 0  PHQ-9 Score - 2 9  Difficult doing work/chores - Not difficult at all Not difficult at all     Fall Risk: Fall Risk  02/28/2018 10/26/2017 09/14/2017  Falls in the past year? 0 No No  Number falls in past yr: 0 - -  Injury with Fall? 0 - -     Assessment & Plan  1. Breast mass, right  - MM Digital Diagnostic Bilat; Future - US BREAST LTD UNI RIGHT INC AXILLA; Future

## 2018-02-28 NOTE — Addendum Note (Signed)
Addended by: Cynda Familia on: 02/28/2018 10:41 AM   Modules accepted: Orders

## 2018-03-09 ENCOUNTER — Ambulatory Visit
Admission: RE | Admit: 2018-03-09 | Discharge: 2018-03-09 | Disposition: A | Discharge status: Home / Self Care | Payer: BLUE CROSS/BLUE SHIELD | Source: Ambulatory Visit | Attending: Family Medicine | Admitting: Family Medicine

## 2018-03-09 ENCOUNTER — Other Ambulatory Visit: Payer: Self-pay | Admitting: Family Medicine

## 2018-03-09 DIAGNOSIS — N63 Unspecified lump in unspecified breast: Secondary | ICD-10-CM | POA: Diagnosis not present

## 2018-03-09 DIAGNOSIS — N6310 Unspecified lump in the right breast, unspecified quadrant: Secondary | ICD-10-CM

## 2018-03-09 DIAGNOSIS — N6012 Diffuse cystic mastopathy of left breast: Secondary | ICD-10-CM | POA: Diagnosis not present

## 2018-03-09 DIAGNOSIS — R922 Inconclusive mammogram: Secondary | ICD-10-CM | POA: Diagnosis not present

## 2018-03-09 DIAGNOSIS — N6011 Diffuse cystic mastopathy of right breast: Secondary | ICD-10-CM | POA: Diagnosis not present

## 2018-04-12 ENCOUNTER — Other Ambulatory Visit: Payer: Self-pay

## 2018-04-12 ENCOUNTER — Encounter: Payer: Self-pay | Admitting: Family Medicine

## 2018-04-12 ENCOUNTER — Ambulatory Visit (INDEPENDENT_AMBULATORY_CARE_PROVIDER_SITE_OTHER): Payer: BLUE CROSS/BLUE SHIELD | Admitting: Family Medicine

## 2018-04-12 VITALS — BP 130/80 | HR 76 | Temp 97.9°F | Resp 16 | Ht 61.0 in | Wt 137.0 lb

## 2018-04-12 DIAGNOSIS — N6310 Unspecified lump in the right breast, unspecified quadrant: Secondary | ICD-10-CM

## 2018-04-12 DIAGNOSIS — K219 Gastro-esophageal reflux disease without esophagitis: Secondary | ICD-10-CM

## 2018-04-12 DIAGNOSIS — F341 Dysthymic disorder: Secondary | ICD-10-CM | POA: Diagnosis not present

## 2018-04-12 NOTE — Progress Notes (Signed)
Name: Tanya Sawyer   MRN: [id number]    DOB: February 25, 1970   Date:04/12/2018       Progress Note  Subjective  Chief Complaint  Chief Complaint  Patient presents with  . Results    here to discuss mammogram results.    HPI  Depression: she has a long history of depression, never affected her life or ability to function, she never took medication, she states when she feels down , she finds something to do and she feels better. Denies suicidal thoughts or ideation, a little more worried lately because of mammogram and corona virus but otherwise feeling well.   Breast Lump; she had mammogram and [place], she states checking right breast and monitoring, not as tender now but is still concerned. Explained she has cysts, she is worrying about it. Discussed going to surgeon, she will think about it and call back if she decides to proceed with referral. Reviewed mammogram and [place] with patient today   GERD: she states no longer having problems, no epigastric pain or burning , not taking medications at this time   Patient Active Problem List   Diagnosis Date Noted  . Symptomatic spider varicose vein 09/14/2017  . GERD (gastroesophageal reflux disease) 09/14/2017    Past Surgical History:  Procedure Laterality Date  . CESAREAN SECTION  1994, 1998    Family History  Problem Relation Age of Onset  . Neuropathy Father   . Hip fracture Paternal Grandmother   . Breast cancer Paternal Aunt 67    Social History   Socioeconomic History  . Marital status: Married    Spouse name: Gulio  . Number of children: 2  . Years of education: Not on file  . Highest education level: High school graduate  Occupational History  . Not on file  Social Needs  . Financial resource strain: Not hard at all  . Food insecurity:    Worry: Never true    Inability: Never true  . Transportation needs:    Medical: No    Non-medical: No  Tobacco Use  . Smoking status: Never Smoker  . Smokeless tobacco: Never Used   Substance and Sexual Activity  . Alcohol use: No    Frequency: Never  . Drug use: No  . Sexual activity: Yes    Partners: Male    Birth control/protection: None  Lifestyle  . Physical activity:    Days per week: 0 days    Minutes per session: 0 min  . Stress: Only a little  Relationships  . Social connections:    Talks on phone: More than three times a week    Gets together: More than three times a week    Attends religious service: 1 to 4 times per year    Active member of club or organization: No    Attends meetings of clubs or organizations: Never    Relationship status: Married  . Intimate partner violence:    Fear of current or ex partner: No    Emotionally abused: No    Physically abused: No    Forced sexual activity: No  Other Topics Concern  . Not on file  Social History Narrative   Married, she has two grown children that are still at home but they work   She is originally from [place], she moved here at age 89 by herself, for a better life She met her husband here, he is also from [place]. 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 Olly Gummy Multi-Vitamins, Disp: , Rfl:  .  triamcinolone ointment (KENALOG) 0.1 %, , Disp: , Rfl:   Allergies  Allergen Reactions  . Penicillins     As a child    I personally reviewed active problem list, medication list, allergies, family history, social history, health maintenance with the patient/caregiver today.   ROS  Constitutional: Negative for fever or weight change.  Respiratory: Negative for cough and shortness of breath.   Cardiovascular: Negative for chest pain or palpitations.  Gastrointestinal: Negative for abdominal pain, no bowel changes.  Musculoskeletal: Negative for gait problem or joint swelling.  Skin: Negative for rash.  Neurological: Negative for dizziness  or headache.  No other specific complaints in a complete review of systems (except as listed in HPI above).  Objective  Vitals:   04/12/18 1050  BP: 130/80  Pulse: 76  Resp: 16  Temp: 97.9 F (36.6 C)  TempSrc: Oral  SpO2: 99%  Weight: 137 lb (62.1 kg)  Height: _0  (1.549 m)    Body mass index is 25.89 kg/m.  Physical Exam  Constitutional: Patient appears well-developed and well-nourished. Overweight.  No distress.  HEENT: head atraumatic, normocephalic, pupils equal and reactive to light, neck supple, throat within normal limits Cardiovascular: Normal rate, regular rhythm and normal heart sounds.  No murmur heard. No BLE edema. Pulmonary/Chest: Effort normal and breath sounds normal. No respiratory distress. Abdominal: Soft.  There is no tenderness. Psychiatric: Patient has a normal mood and affect. behavior is normal. Judgment and thought content normal.  PHQ2/9: Depression screen [facility] 2/9 04/12/2018 02/28/2018 10/26/2017 09/14/2017  Decreased Interest 1 0 0 2  Down, Depressed, Hopeless 1 0 0 2  PHQ - 2 Score 2 0 0 4  Altered sleeping 0 - 1 1  Tired, decreased energy 0 - 1 1  Change in appetite 0 - 0 0  Feeling bad or failure about yourself  0 - 0 1  Trouble concentrating 1 - 0 2  Moving slowly or fidgety/restless 0 - 0 0  Suicidal thoughts 0 - 0 0  PHQ-9 Score 3 - 2 9  Difficult doing work/chores Not difficult at all - Not difficult at all Not difficult at all   phq 9 negative but  she is worried about recent mammogram test   Fall Risk: Fall Risk  04/12/2018 02/28/2018 10/26/2017 09/14/2017  Falls in the past year? 0 0 No No  Number falls in past yr: 0 0 - -  Injury with Fall? 0 0 - -     Assessment & Plan  1. Breast mass, right  Reassurance, call back if needed   2. Dysthymia  Stable  3. Gastroesophageal reflux disease without esophagitis  Doing well at this time

## 2018-07-27 DIAGNOSIS — Z20828 Contact with and (suspected) exposure to other viral communicable diseases: Secondary | ICD-10-CM | POA: Diagnosis not present

## 2018-10-30 ENCOUNTER — Encounter: Payer: BLUE CROSS/BLUE SHIELD | Admitting: Family Medicine

## 2019-01-17 ENCOUNTER — Encounter: Payer: Self-pay | Admitting: Family Medicine

## 2019-01-17 ENCOUNTER — Ambulatory Visit (INDEPENDENT_AMBULATORY_CARE_PROVIDER_SITE_OTHER): Payer: BC Managed Care – PPO | Admitting: Family Medicine

## 2019-01-17 VITALS — BP 134/90 | HR 84 | Temp 97.3°F | Resp 16 | Ht 61.5 in | Wt 139.2 lb

## 2019-01-17 DIAGNOSIS — Z0000 Encounter for general adult medical examination without abnormal findings: Secondary | ICD-10-CM

## 2019-01-17 DIAGNOSIS — Z1231 Encounter for screening mammogram for malignant neoplasm of breast: Secondary | ICD-10-CM | POA: Diagnosis not present

## 2019-01-17 DIAGNOSIS — R439 Unspecified disturbances of smell and taste: Secondary | ICD-10-CM | POA: Diagnosis not present

## 2019-01-17 DIAGNOSIS — Z1211 Encounter for screening for malignant neoplasm of colon: Secondary | ICD-10-CM

## 2019-01-17 NOTE — Patient Instructions (Signed)
Preventive Care 40-49 Years Old, Female Preventive care refers to visits with your health care provider and lifestyle choices that can promote health and wellness. This includes:  A yearly physical exam. This may also be called an annual well check.  Regular dental visits and eye exams.  Immunizations.  Screening for certain conditions.  Healthy lifestyle choices, such as eating a healthy diet, getting regular exercise, not using drugs or products that contain nicotine and tobacco, and limiting alcohol use. What can I expect for my preventive care visit? Physical exam Your health care provider will check your:  Height and weight. This may be used to calculate body mass index (BMI), which tells if you are at a healthy weight.  Heart rate and blood pressure.  Skin for abnormal spots. Counseling Your health care provider may ask you questions about your:  Alcohol, tobacco, and drug use.  Emotional well-being.  Home and relationship well-being.  Sexual activity.  Eating habits.  Work and work environment.  Method of birth control.  Menstrual cycle.  Pregnancy history. What immunizations do I need?  Influenza (flu) vaccine  This is recommended every year. Tetanus, diphtheria, and pertussis (Tdap) vaccine  You may need a Td booster every 10 years. Varicella (chickenpox) vaccine  You may need this if you have not been vaccinated. Zoster (shingles) vaccine  You may need this after age 49. Measles, mumps, and rubella (MMR) vaccine  You may need at least one dose of MMR if you were born in 1957 or later. You may also need a second dose. Pneumococcal conjugate (PCV13) vaccine  You may need this if you have certain conditions and were not previously vaccinated. Pneumococcal polysaccharide (PPSV23) vaccine  You may need one or two doses if you smoke cigarettes or if you have certain conditions. Meningococcal conjugate (MenACWY) vaccine  You may need this if you  have certain conditions. Hepatitis A vaccine  You may need this if you have certain conditions or if you travel or work in places where you may be exposed to hepatitis A. Hepatitis B vaccine  You may need this if you have certain conditions or if you travel or work in places where you may be exposed to hepatitis B. Haemophilus influenzae type b (Hib) vaccine  You may need this if you have certain conditions. Human papillomavirus (HPV) vaccine  If recommended by your health care provider, you may need three doses over 6 months. You may receive vaccines as individual doses or as more than one vaccine together in one shot (combination vaccines). Talk with your health care provider about the risks and benefits of combination vaccines. What tests do I need? Blood tests  Lipid and cholesterol levels. These may be checked every 5 years, or more frequently if you are over 49 years old.  Hepatitis C test.  Hepatitis B test. Screening  Lung cancer screening. You may have this screening every year starting at age 49 if you have a 30-pack-year history of smoking and currently smoke or have quit within the past 15 years.  Colorectal cancer screening. All adults should have this screening starting at age 49 and continuing until age 75. Your health care provider may recommend screening at age 45 if you are at increased risk. You will have tests every 1-10 years, depending on your results and the type of screening test.  Diabetes screening. This is done by checking your blood sugar (glucose) after you have not eaten for a while (fasting). You may have this   done every 1-3 years.  Mammogram. This may be done every 1-2 years. Talk with your health care provider about when you should start having regular mammograms. This may depend on whether you have a family history of breast cancer.  BRCA-related cancer screening. This may be done if you have a family history of breast, ovarian, tubal, or peritoneal  cancers.  Pelvic exam and Pap test. This may be done every 3 years starting at age 21. Starting at age 30, this may be done every 5 years if you have a Pap test in combination with an HPV test. Other tests  Sexually transmitted disease (STD) testing.  Bone density scan. This is done to screen for osteoporosis. You may have this scan if you are at high risk for osteoporosis. Follow these instructions at home: Eating and drinking  Eat a diet that includes fresh fruits and vegetables, whole grains, lean protein, and low-fat dairy.  Take vitamin and mineral supplements as recommended by your health care provider.  Do not drink alcohol if: ? Your health care provider tells you not to drink. ? You are pregnant, may be pregnant, or are planning to become pregnant.  If you drink alcohol: ? Limit how much you have to 0-1 drink a day. ? Be aware of how much alcohol is in your drink. In the [place] one drink equals one 12 oz bottle of beer (355 mL), one 5 oz glass of wine (148 mL), or one 1 oz glass of hard liquor (44 mL). Lifestyle  Take daily care of your teeth and gums.  Stay active. Exercise for at least 30 minutes on 5 or more days each week.  Do not use any products that contain nicotine or tobacco, such as cigarettes, e-cigarettes, and chewing tobacco. If you need help quitting, ask your health care provider.  If you are sexually active, practice safe sex. Use a condom or other form of birth control (contraception) in order to prevent pregnancy and STIs (sexually transmitted infections).  If told by your health care provider, take low-dose aspirin daily starting at age 49. What's next?  Visit your health care provider once a year for a well check visit.  Ask your health care provider how often you should have your eyes and teeth checked.  Stay up to date on all vaccines. This information is not intended to replace advice given to you by your health care provider. Make sure you  discuss any questions you have with your health care provider. Document Released: 02/13/2015 Document Revised: 09/28/2017 Document Reviewed: 09/28/2017 Elsevier Patient Education  2020 Elsevier Inc.  

## 2019-01-17 NOTE — Progress Notes (Signed)
Name: Tanya Sawyer   MRN: [id number]    DOB: 07/22/1970   Date:01/17/2019       Progress Note  Subjective  Chief Complaint  Chief Complaint  Patient presents with  . Annual Exam    HPI  Patient presents for annual CPE.  Diet: she cooks at home, she eats fast food twice a week Exercise: not active, needs to increase to 150 minutes per week   USPSTF grade A and B recommendations    [facility] Visit from 01/17/2019 in [facility]  AUDIT-C Score  0     Depression: Phq 9 is  negative Depression screen [facility] 2/9 01/17/2019 04/12/2018 04/12/2018 02/28/2018 10/26/2017  Decreased Interest 0 1 1 0 0  Down, Depressed, Hopeless 0 1 1 0 0  PHQ - 2 Score 0 2 2 0 0  Altered sleeping 1 0 0 - 1  Tired, decreased energy 0 0 0 - 1  Change in appetite 0 0 0 - 0  Feeling bad or failure about yourself  0 0 0 - 0  Trouble concentrating 1 1 1  - 0  Moving slowly or fidgety/restless 0 0 0 - 0  Suicidal thoughts 0 0 0 - 0  PHQ-9 Score 2 3 3  - 2  Difficult doing work/chores Not difficult at all Not difficult at all Not difficult at all - Not difficult at all   Hypertension: BP Readings from Last 3 Encounters:  01/17/19 134/90  04/12/18 130/80  02/28/18 120/70   Obesity: Wt Readings from Last 3 Encounters:  01/17/19 139 lb 3.2 oz (63.1 kg)  04/12/18 137 lb (62.1 kg)  02/28/18 134 lb 14.4 oz (61.2 kg)   BMI Readings from Last 3 Encounters:  01/17/19 25.88 kg/m  04/12/18 25.89 kg/m  02/28/18 25.49 kg/m     Hep C Screening: she does not want to check labs today  STD testing and prevention (HIV/chl/gon/syphilis): not interested, she is married  Intimate partner violence: negative screen  Sexual History (Partners/Practices/Protection from [employer] hx STI/Pregnancy Plans):  Pain during Intercourse: no pain  Menstrual History/LMP/Abnormal Bleeding: she still has cycles, she skipped one month this year, discussed peri-menopause  Incontinence Symptoms: she has episodes of pain  in the supra pubic area after she voids. Discussed urine culture but she wants to hold off for now   Breast cancer:  - Last Mammogram: 03/2017  - BRCA gene screening: N/A Osteoporosis: Discussed high calcium and vitamin D supplementation, weight bearing exercises  Cervical cancer screening: up to date   Skin cancer: Discussed monitoring for atypical lesions  Colorectal cancer: referral cologuard   Lung cancer: Low Dose CT Chest recommended if Age 50-80 years, 30 pack-year currently smoking OR have quit w/in 15years. Patient does not qualify.    Advanced Care Planning: A voluntary discussion about advance care planning including the explanation and discussion of advance directives.  Discussed health care proxy and Living will, and the patient was able to identify a health care proxy as husband   Patient does not have a living will at present time.   Lipids: Lab Results  Component Value Date   CHOL 182 10/26/2017   Lab Results  Component Value Date   HDL 61 10/26/2017   Lab Results  Component Value Date   LDLCALC 103 (H) 10/26/2017   Lab Results  Component Value Date   TRIG 90 10/26/2017   Lab Results  Component Value Date   CHOLHDL 3.0 10/26/2017   No results found for: LDLDIRECT  Glucose: Glucose, Bld  Date Value Ref Range Status  10/26/2017 85 65 - 99 mg/dL Final    Comment:    .            Fasting reference interval .     Patient Active Problem List   Diagnosis Date Noted  . Symptomatic spider varicose vein 09/14/2017  . GERD (gastroesophageal reflux disease) 09/14/2017    Past Surgical History:  Procedure Laterality Date  . CESAREAN SECTION  1994, 1998    Family History  Problem Relation Age of Onset  . Neuropathy Father   . Hip fracture Paternal Grandmother   . Breast cancer Paternal Aunt 42    Social History   Socioeconomic History  . Marital status: Married    Spouse name: Gulio  . Number of children: 2  . Years of education: Not on file   . Highest education level: High school graduate  Occupational History  . Not on file  Tobacco Use  . Smoking status: Never Smoker  . Smokeless tobacco: Never Used  Substance and Sexual Activity  . Alcohol use: No  . Drug use: No  . Sexual activity: Yes    Partners: Male    Birth control/protection: None  Other Topics Concern  . Not on file  Social History Narrative   Married, she has two grown children that are still at home but they work   She is originally from [place], she moved here at age 68 by herself, for a better life She met her husband here, he is also from [place]. 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 Difficulty of Paying Living Expenses: Not hard at all  Food Insecurity: No Food Insecurity  . Worried About Charity fundraiser in the Last Year: Never true  . Ran Out of Food in the Last Year: Never true  Transportation Needs: No Transportation Needs  . Lack of Transportation (Medical): No  . Lack of Transportation (Non-Medical): No  Physical Activity: Inactive  . Days of Exercise per Week: 0 days  . Minutes of Exercise per Session: 0 min  Stress: No Stress Concern Present  . Feeling of Stress : Only a little  Social Connections: Not Isolated  . Frequency of Communication with Friends and Family: More than three times a week  . Frequency of Social Gatherings with Friends and Family: More than three times a week  . Attends Religious Services: More than 4 times per year  . Active Member of Clubs or Organizations: Yes  . Attends Archivist Meetings: More than 4 times per year  . Marital Status: Married  Human resources officer Violence: Not At Risk  . Fear of Current or Ex-Partner: No  . Emotionally Abused: No  . Physically Abused: No  . Sexually Abused: No     Current Outpatient Medications:  Marland Kitchen  Multiple Vitamins-Minerals (MULTI-VITAMIN GUMMIES) CHEW, Chew 1  each by mouth daily. Olly Gummy Multi-Vitamins, Disp: , Rfl:  .  triamcinolone ointment (KENALOG) 0.1 %, , Disp: , Rfl:   Allergies  Allergen Reactions  . Penicillins     As a child     ROS  Constitutional: Negative for fever or weight change.  Respiratory: Negative for cough and shortness of breath.   Cardiovascular: Negative for chest pain or palpitations.  Gastrointestinal: Negative for abdominal pain, no bowel changes.  Musculoskeletal:  Negative for gait problem or joint swelling.  Skin: Negative for rash.  Neurological: Negative for dizziness or headache.  No other specific complaints in a complete review of systems (except as listed in HPI above).  Objective  Vitals:   01/17/19 0957 01/17/19 1001  BP: 120/86 134/90  Pulse: 84   Resp: 16   Temp: (!) 97.3 F (36.3 C)   TempSrc: Temporal   SpO2: 98%   Weight: 139 lb 3.2 oz (63.1 kg)   Height: 5' 1.5" (1.562 m)     Body mass index is 25.88 kg/m.  Physical Exam  Constitutional: Patient appears well-developed and well-nourished. No distress.  HENT: Head: Normocephalic and atraumatic. Ears: B TMs ok, no erythema or effusion; Nose: Nose normal. Mouth/Throat: Oropharynx is clear and moist. No oropharyngeal exudate.  Eyes: Conjunctivae and EOM are normal. Pupils are equal, round, and reactive to light. No scleral icterus.  Neck: Normal range of motion. Neck supple. No JVD present. No thyromegaly present.  Cardiovascular: Normal rate, regular rhythm and normal heart sounds.  No murmur heard. No BLE edema. Pulmonary/Chest: Effort normal and breath sounds normal. No respiratory distress. Abdominal: Soft. Bowel sounds are normal, no distension. There is no tenderness. no masses Breast: no lumps or masses, no nipple discharge or rashes FEMALE GENITALIA:  External genitalia normal External urethra normal Pelvic not done RECTAL: not done Musculoskeletal: Normal range of motion, no joint effusions. No gross  deformities Neurological: he is alert and oriented to person, place, and time. No cranial nerve deficit. Coordination, balance, strength, speech and gait are normal.  Skin: Skin is warm and dry. No rash noted. No erythema.  Psychiatric: Patient has a normal mood and affect. behavior is normal. Judgment and thought content normal.  Fall Risk: Fall Risk  01/17/2019 04/12/2018 02/28/2018 10/26/2017 09/14/2017  Falls in the past year? 0 0 0 No No  Number falls in past yr: 0 0 0 - -  Injury with Fall? 0 0 0 - -     Functional Status Survey: Is the patient deaf or have difficulty hearing?: No Does the patient have difficulty seeing, even when wearing glasses/contacts?: No Does the patient have difficulty concentrating, remembering, or making decisions?: No Does the patient have difficulty walking or climbing stairs?: No Does the patient have difficulty dressing or bathing?: No Does the patient have difficulty doing errands alone such as visiting a doctor's office or shopping?: No   Assessment & Plan  1. Well adult exam   2. Breast cancer screening by mammogram  - MM 3D SCREEN BREAST BILATERAL; Future  3. Disturbances of sensation of smell and taste  Going on for about 4 months, intermittent. Discussed referral to ENT but she wants to hold off  She had COVID this Summer , symptoms started a few months afterwards. She will come back in if needed   -USPSTF grade A and B recommendations reviewed with patient; age-appropriate recommendations, preventive care, screening tests, etc discussed and encouraged; healthy living encouraged; see AVS for patient education given to patient -Discussed importance of 150 minutes of physical activity weekly, eat two servings of fish weekly, eat one serving of tree nuts ( cashews, pistachios, pecans, almonds.Marland Kitchen) every other day, eat 6 servings of fruit/vegetables daily and drink plenty of water and avoid sweet beverages.

## 2019-03-19 ENCOUNTER — Telehealth: Payer: Self-pay | Admitting: Family Medicine

## 2019-03-19 ENCOUNTER — Telehealth: Payer: Self-pay

## 2019-03-19 NOTE — Telephone Encounter (Signed)
Attempted to contact patient in regard to Cologuard message from today. Mail box is full. Unable to leave message.

## 2019-03-19 NOTE — Telephone Encounter (Signed)
Attempted to contact patient in regard to Cologuard message from today. Mail box is full. Unable to leave message. 

## 2019-03-19 NOTE — Telephone Encounter (Signed)
Cologuard faxed and informed us her sample stability limited had been exceeded and she will have to collect another specimen. Her voicemail box was full, please inform her to contact them and have Cologuard sent her a new kit at [phone].

## 2019-03-20 NOTE — Telephone Encounter (Signed)
Attempted to call patient- no answer and mailbox is full- unable to leave message

## 2019-03-21 NOTE — Telephone Encounter (Signed)
Attempted to call patient to notify her of need to repeat Cologuard test. Patient did not answer call and mailbox is full- unable to leave message. 3rd attempt- call routed back to office pool for next steps

## 2019-04-18 ENCOUNTER — Ambulatory Visit: Payer: BC Managed Care – PPO | Admitting: Family Medicine

## 2019-04-29 LAB — EXTERNAL GENERIC LAB PROCEDURE: COLOGUARD: NEGATIVE

## 2019-04-29 LAB — COLOGUARD: Cologuard: NEGATIVE

## 2019-04-30 ENCOUNTER — Telehealth: Payer: Self-pay

## 2019-04-30 ENCOUNTER — Encounter: Payer: Self-pay | Admitting: Family Medicine

## 2019-04-30 NOTE — Telephone Encounter (Signed)
Attempted to call patient, no answer. Voicemail box is full and can not accept messages at this time.

## 2019-04-30 NOTE — Telephone Encounter (Signed)
Called to inform Mrs. Stjulien her Cologuard results came back Negative. She will not have to complete another test for 3 years.

## 2019-05-08 ENCOUNTER — Ambulatory Visit (INDEPENDENT_AMBULATORY_CARE_PROVIDER_SITE_OTHER): Payer: BC Managed Care – PPO | Admitting: Family Medicine

## 2019-05-08 ENCOUNTER — Other Ambulatory Visit: Payer: Self-pay

## 2019-05-08 ENCOUNTER — Encounter: Payer: Self-pay | Admitting: Family Medicine

## 2019-05-08 VITALS — BP 120/80 | HR 84 | Temp 97.5°F | Resp 16 | Ht 61.5 in | Wt 137.1 lb

## 2019-05-08 DIAGNOSIS — R1011 Right upper quadrant pain: Secondary | ICD-10-CM

## 2019-05-08 DIAGNOSIS — F341 Dysthymic disorder: Secondary | ICD-10-CM | POA: Diagnosis not present

## 2019-05-08 DIAGNOSIS — K219 Gastro-esophageal reflux disease without esophagitis: Secondary | ICD-10-CM

## 2019-05-08 NOTE — Patient Instructions (Signed)
Ask insurance how much for a gallbladder US? Rule out gallstones

## 2019-05-08 NOTE — Progress Notes (Signed)
Name: Tanya Sawyer   MRN: [id number]    DOB: [birth year]   Date:05/08/2019       Progress Note  Subjective  Chief Complaint  Chief Complaint  Patient presents with  . Gastroesophageal Reflux    She had a flare about 2 months ago. She avoids certain foods and greasy foods and drinks ginger tea to help with GERD.    HPI  Depression: she has a long history of depression, never affected her life or ability to function, she never took medication, she states when she feels down , she finds something to do and she feels better. Denies suicidal thoughts or ideation. Unchanged   Breast Lump; she had mammogram done in 03/2018 and had to go back for [place] that came back negative. She will call back for a recheck  GERD: she states no longer having problems, she had a flare about two months ago. She states she thinks it was secondary to stress. At the time her aunt was very sick with COVID-19 and died , her father was sick. She states the pain was on the epigastric pain and radiated to right lower quadrant. It was described as burning or sharp  like and lasted for about one week.  She did not take any medications. She followed a GERD appropriate diet and symptoms resolved   RUQ: she has intermittent RUQ pain, she gets nauseated and bloating but no vomiting. Discussed gallbladder US . She states cheese and sour cream/dairy food causes a change in her taste sensation She will let me know when she is ready for an [place]  Patient Active Problem List   Diagnosis Date Noted  . Symptomatic spider varicose vein 09/14/2017  . GERD (gastroesophageal reflux disease) 09/14/2017    Past Surgical History:  Procedure Laterality Date  . CESAREAN SECTION  1994, 1998    Family History  Problem Relation Age of Onset  . Neuropathy Father   . Hip fracture Paternal Grandmother   . Breast cancer Paternal Aunt 60    Social History   Tobacco Use  . Smoking status: Never Smoker  . Smokeless tobacco: Never Used   Substance Use Topics  . Alcohol use: No     Current Outpatient Medications:  Marland Kitchen  Multiple Vitamins-Minerals (MULTI-VITAMIN GUMMIES) CHEW, Chew 1 each by mouth daily. Olly Gummy Multi-Vitamins, Disp: , Rfl:  .  triamcinolone ointment (KENALOG) 0.1 %, , Disp: , Rfl:   Allergies  Allergen Reactions  . Penicillins     As a child    I personally reviewed active problem list, medication list, allergies, family history, social history, health maintenance with the patient/caregiver today.   ROS  Constitutional: Negative for fever or weight change.  Respiratory: Negative for cough and shortness of breath.   Cardiovascular: Negative for chest pain or palpitations.  Gastrointestinal: Negative for abdominal pain, no bowel changes.  Musculoskeletal: Negative for gait problem or joint swelling.  Skin: Negative for rash.  Neurological: Negative for dizziness or headache.  No other specific complaints in a complete review of systems (except as listed in HPI above).  Objective  Vitals:   05/08/19 0924  BP: 120/80  Pulse: 84  Resp: 16  Temp: (!) 97.5 F (36.4 C)  TempSrc: Temporal  SpO2: 98%  Weight: 137 lb 1.6 oz (62.2 kg)  Height: 5' 1.5" (1.562 m)    Body mass index is 25.49 kg/m.  Physical Exam  Constitutional: Patient appears well-developed and well-nourished.  No distress.  HEENT: head atraumatic, normocephalic,  pupils equal and reactive to light Cardiovascular: Normal rate, regular rhythm and normal heart sounds.  No murmur heard. No BLE edema. Pulmonary/Chest: Effort normal and breath sounds normal. No respiratory distress. Abdominal: Soft.  There is no tenderness. Psychiatric: Patient has a normal mood and affect. behavior is normal. Judgment and thought content normal.  Recent Results (from the past 2160 hour(s))  Cologuard     Status: None   Collection Time: 04/24/19 12:00 AM  Result Value Ref Range   Cologuard Negative Negative    PHQ2/9: Depression screen  [facility] 2/9 05/08/2019 01/17/2019 04/12/2018 04/12/2018 02/28/2018  Decreased Interest 0 0 1 1 0  Down, Depressed, Hopeless 0 0 1 1 0  PHQ - 2 Score 0 0 2 2 0  Altered sleeping 0 1 0 0 -  Tired, decreased energy 0 0 0 0 -  Change in appetite 0 0 0 0 -  Feeling bad or failure about yourself  0 0 0 0 -  Trouble concentrating 0 1 1 1  -  Moving slowly or fidgety/restless 0 0 0 0 -  Suicidal thoughts 0 0 0 0 -  PHQ-9 Score 0 2 3 3  -  Difficult doing work/chores - Not difficult at all Not difficult at all Not difficult at all -    phq 9 is negative   Fall Risk: Fall Risk  05/08/2019 01/17/2019 04/12/2018 02/28/2018 10/26/2017  Falls in the past year? 0 0 0 0 No  Number falls in past yr: 0 0 0 0 -  Injury with Fall? 0 0 0 0 -     Functional Status Survey: Is the patient deaf or have difficulty hearing?: No Does the patient have difficulty seeing, even when wearing glasses/contacts?: No Does the patient have difficulty concentrating, remembering, or making decisions?: No Does the patient have difficulty walking or climbing stairs?: No Does the patient have difficulty dressing or bathing?: No Does the patient have difficulty doing errands alone such as visiting a doctor's office or shopping?: No    Assessment & Plan   1. Gastroesophageal reflux disease without esophagitis  Continue GERD appropriate diet   2. Dysthymia  Doing well   3. RUQ pain  She wants to hold off on [place], she will find out about cost first and call me back when ready to have study done

## 2019-07-18 ENCOUNTER — Ambulatory Visit
Admission: RE | Admit: 2019-07-18 | Discharge: 2019-07-18 | Disposition: A | Discharge status: Home / Self Care | Payer: BLUE CROSS/BLUE SHIELD | Source: Ambulatory Visit | Attending: Family Medicine | Admitting: Family Medicine

## 2019-07-18 DIAGNOSIS — Z1231 Encounter for screening mammogram for malignant neoplasm of breast: Secondary | ICD-10-CM | POA: Insufficient documentation

## 2019-11-06 NOTE — Progress Notes (Addendum)
Name: Tanya Sawyer   MRN: [id number]    DOB: 06/07/1970   Date:11/07/2019       Progress Note  Subjective  Chief Complaint  Chief Complaint  Patient presents with  . Follow-up    HPI  Depression: she has a long history of depression, never affected her life or ability to function, she never took medication, she states when she feels down , she finds something to do and she feels better. Denies suicidal thoughts or ideation. She states doing well at this time  GERD: she states about one month ago she was having heartburn but changed her diet, she has smaller portions, avoiding spicy and fried food, cutting down on citric juices and caffeine . She states controlling her symptoms   RUQ: she has intermittent RUQ pain, she gets nauseated and bloating but no vomiting. Discussed gallbladder US . No recent episodes.   Eczema: she needs refill of triamcinolone, also has a rash on right forehead, gets red and also some pruritus.   Left knee pain: she has noticed some instability on left knee, intermittent , not much pain, no redness or swelling, she feels mostly when going down hill.   Patient Active Problem List   Diagnosis Date Noted  . Dysthymia 11/07/2019  . Symptomatic spider varicose vein 09/14/2017  . GERD (gastroesophageal reflux disease) 09/14/2017    Past Surgical History:  Procedure Laterality Date  . CESAREAN SECTION  1994, 1998    Family History  Problem Relation Age of Onset  . Neuropathy Father   . Hip fracture Paternal Grandmother   . Breast cancer Paternal Aunt 60    Social History   Tobacco Use  . Smoking status: Never Smoker  . Smokeless tobacco: Never Used  Substance Use Topics  . Alcohol use: No     Current Outpatient Medications:  Marland Kitchen  Multiple Vitamins-Minerals (MULTI-VITAMIN GUMMIES) CHEW, Chew 1 each by mouth daily. Olly Gummy Multi-Vitamins, Disp: , Rfl:  .  triamcinolone ointment (KENALOG) 0.1 %, , Disp: , Rfl:   Allergies  Allergen Reactions   . Penicillins     As a child    I personally reviewed active problem list, medication list, allergies, family history, social history with the patient/caregiver today.   ROS  Constitutional: Negative for fever or weight change.  Respiratory: Negative for cough and shortness of breath.   Cardiovascular: Negative for chest pain or palpitations.  Gastrointestinal: Negative for abdominal pain, no bowel changes.  Musculoskeletal: Negative for gait problem or joint swelling.  Skin: positive  for rash.  Neurological: Negative for dizziness or headache.  No other specific complaints in a complete review of systems (except as listed in HPI above).  Objective  Vitals:   11/07/19 0939  BP: 130/80  Pulse: 94  Temp: 98.4 F (36.9 C)  SpO2: 99%  Weight: 137 lb 11.2 oz (62.5 kg)  Height: 5' 2.5" (1.588 m)    Body mass index is 24.78 kg/m.  Physical Exam  Constitutional: Patient appears well-developed and well-nourished.  No distress.  HEENT: head atraumatic, normocephalic, pupils equal and reactive to light, neck supple Cardiovascular: Normal rate, regular rhythm and normal heart sounds.  No murmur heard. No BLE edema. Pulmonary/Chest: Effort normal and breath sounds normal. No respiratory distress. Abdominal: Soft.  There is no tenderness. Skin: eczematous patch on right arm, she also has a spot on right forehead - eczematous patch with some erythema  Psychiatric: Patient has a normal mood and affect. behavior is normal. Judgment and  thought content normal.  PHQ2/9: Depression screen [facility] 2/9 11/07/2019 05/08/2019 01/17/2019 04/12/2018 04/12/2018  Decreased Interest 0 0 0 1 1  Down, Depressed, Hopeless 0 0 0 1 1  PHQ - 2 Score 0 0 0 2 2  Altered sleeping - 0 1 0 0  Tired, decreased energy - 0 0 0 0  Change in appetite - 0 0 0 0  Feeling bad or failure about yourself  - 0 0 0 0  Trouble concentrating - 0 1 1 1   Moving slowly or fidgety/restless - 0 0 0 0  Suicidal thoughts - 0 0 0  0  PHQ-9 Score - 0 2 3 3   Difficult doing work/chores - - Not difficult at all Not difficult at all Not difficult at all    phq 9 is negative   Fall Risk: Fall Risk  11/07/2019 05/08/2019 01/17/2019 04/12/2018 02/28/2018  Falls in the past year? 0 0 0 0 0  Number falls in past yr: 0 0 0 0 0  Injury with Fall? 0 0 0 0 0    Functional Status Survey: Is the patient deaf or have difficulty hearing?: No Does the patient have difficulty seeing, even when wearing glasses/contacts?: No Does the patient have difficulty concentrating, remembering, or making decisions?: No Does the patient have difficulty walking or climbing stairs?: Yes Does the patient have difficulty dressing or bathing?: No Does the patient have difficulty doing errands alone such as visiting a doctor's office or shopping?: No    Assessment & Plan  1. Gastroesophageal reflux disease without esophagitis  Doing well with life style modification   2. Dysthymia  Doing better   3. Facial eczema  - pimecrolimus (ELIDEL) 1 % cream; Apply topically 2 (two) times daily.  Dispense: 30 g; Refill: 0  4. Other eczema  - triamcinolone ointment (KENALOG) 0.1 %; Apply topically 2 (two) times daily.  Dispense: 80 g; Refill: 1  5. Needs flu shot  refused  6. Chronic pain of left knee  Discussed home exercises for now    7. Need for shingles vaccine  Today

## 2019-11-07 ENCOUNTER — Ambulatory Visit (INDEPENDENT_AMBULATORY_CARE_PROVIDER_SITE_OTHER): Payer: BC Managed Care – PPO | Admitting: Family Medicine

## 2019-11-07 ENCOUNTER — Telehealth: Payer: Self-pay

## 2019-11-07 ENCOUNTER — Encounter: Payer: Self-pay | Admitting: Family Medicine

## 2019-11-07 ENCOUNTER — Other Ambulatory Visit: Payer: Self-pay

## 2019-11-07 VITALS — BP 130/80 | HR 94 | Temp 98.4°F | Ht 62.5 in | Wt 137.7 lb

## 2019-11-07 DIAGNOSIS — K219 Gastro-esophageal reflux disease without esophagitis: Secondary | ICD-10-CM | POA: Diagnosis not present

## 2019-11-07 DIAGNOSIS — M25562 Pain in left knee: Secondary | ICD-10-CM

## 2019-11-07 DIAGNOSIS — L309 Dermatitis, unspecified: Secondary | ICD-10-CM | POA: Diagnosis not present

## 2019-11-07 DIAGNOSIS — F341 Dysthymic disorder: Secondary | ICD-10-CM | POA: Diagnosis not present

## 2019-11-07 DIAGNOSIS — L308 Other specified dermatitis: Secondary | ICD-10-CM | POA: Diagnosis not present

## 2019-11-07 DIAGNOSIS — Z23 Encounter for immunization: Secondary | ICD-10-CM

## 2019-11-07 DIAGNOSIS — G8929 Other chronic pain: Secondary | ICD-10-CM

## 2019-11-07 MED ORDER — TRIAMCINOLONE ACETONIDE 0.1 % EX OINT: TOPICAL_OINTMENT | Freq: Two times a day (BID) | CUTANEOUS | 1 refills | Status: DC

## 2019-11-07 MED ORDER — PIMECROLIMUS 1 % EX CREA: TOPICAL_CREAM | Freq: Two times a day (BID) | CUTANEOUS | 0 refills | Status: DC

## 2019-11-07 NOTE — Telephone Encounter (Signed)
Pt was seen today and stated that you where suppose to give her a printout for exercises for her leg. Please return call [phone]

## 2019-11-07 NOTE — Patient Instructions (Signed)
Journal for Nurse Practitioners, 15(4), 263-267. Retrieved November 06, 2017 from [email or web address].com/[email or web address]">  Knee Exercises Ask your health care provider which exercises are safe for you. Do exercises exactly as told by your health care provider and adjust them as directed. It is normal to feel mild stretching, pulling, tightness, or discomfort as you do these exercises. Stop right away if you feel sudden pain or your pain gets worse. Do not begin these exercises until told by your health care provider. Stretching and range-of-motion exercises These exercises warm up your muscles and joints and improve the movement and flexibility of your knee. These exercises also help to relieve pain and swelling. Knee extension, prone 1. Lie on your abdomen (prone position) on a bed. 2. Place your left / right knee just beyond the edge of the surface so your knee is not on the bed. You can put a towel under your left / right thigh just above your kneecap for comfort. 3. Relax your leg muscles and allow gravity to straighten your knee (extension). You should feel a stretch behind your left / right knee. 4. Hold this position for __________ seconds. 5. Scoot up so your knee is supported between repetitions. Repeat __________ times. Complete this exercise __________ times a day. Knee flexion, active  1. Lie on your back with both legs straight. If this causes back discomfort, bend your left / right knee so your foot is flat on the floor. 2. Slowly slide your left / right heel back toward your buttocks. Stop when you feel a gentle stretch in the front of your knee or thigh (flexion). 3. Hold this position for __________ seconds. 4. Slowly slide your left / right heel back to the starting position. Repeat __________ times. Complete this exercise __________ times a day. Quadriceps stretch, prone  1. Lie on your abdomen on a firm surface, such as a bed or padded floor. 2. Bend your left / right knee and hold  your ankle. If you cannot reach your ankle or pant leg, loop a belt around your foot and grab the belt instead. 3. Gently pull your heel toward your buttocks. Your knee should not slide out to the side. You should feel a stretch in the front of your thigh and knee (quadriceps). 4. Hold this position for __________ seconds. Repeat __________ times. Complete this exercise __________ times a day. Hamstring, supine 1. Lie on your back (supine position). 2. Loop a belt or towel over the ball of your left / right foot. The ball of your foot is on the walking surface, right under your toes. 3. Straighten your left / right knee and slowly pull on the belt to raise your leg until you feel a gentle stretch behind your knee (hamstring). ? Do not let your knee bend while you do this. ? Keep your other leg flat on the floor. 4. Hold this position for __________ seconds. Repeat __________ times. Complete this exercise __________ times a day. Strengthening exercises These exercises build strength and endurance in your knee. Endurance is the ability to use your muscles for a long time, even after they get tired. Quadriceps, isometric This exercise stretches the muscles in front of your thigh (quadriceps) without moving your knee joint (isometric). 1. Lie on your back with your left / right leg extended and your other knee bent. Put a rolled towel or small pillow under your knee if told by your health care provider. 2. Slowly tense the muscles in the front of your left /   right thigh. You should see your kneecap slide up toward your hip or see increased dimpling just above the knee. This motion will push the back of the knee toward the floor. 3. For __________ seconds, hold the muscle as tight as you can without increasing your pain. 4. Relax the muscles slowly and completely. Repeat __________ times. Complete this exercise __________ times a day. Straight leg raises This exercise stretches the muscles in front  of your thigh (quadriceps) and the muscles that move your hips (hip flexors). 1. Lie on your back with your left / right leg extended and your other knee bent. 2. Tense the muscles in the front of your left / right thigh. You should see your kneecap slide up or see increased dimpling just above the knee. Your thigh may even shake a bit. 3. Keep these muscles tight as you raise your leg 4-6 inches (10-15 cm) off the floor. Do not let your knee bend. 4. Hold this position for __________ seconds. 5. Keep these muscles tense as you lower your leg. 6. Relax your muscles slowly and completely after each repetition. Repeat __________ times. Complete this exercise __________ times a day. Hamstring, isometric 1. Lie on your back on a firm surface. 2. Bend your left / right knee about __________ degrees. 3. Dig your left / right heel into the surface as if you are trying to pull it toward your buttocks. Tighten the muscles in the back of your thighs (hamstring) to "dig" as hard as you can without increasing any pain. 4. Hold this position for __________ seconds. 5. Release the tension gradually and allow your muscles to relax completely for __________ seconds after each repetition. Repeat __________ times. Complete this exercise __________ times a day. Hamstring curls If told by your health care provider, do this exercise while wearing ankle weights. Begin with __________ lb weights. Then increase the weight by 1 lb (0.5 kg) increments. Do not wear ankle weights that are more than __________ lb. 1. Lie on your abdomen with your legs straight. 2. Bend your left / right knee as far as you can without feeling pain. Keep your hips flat against the floor. 3. Hold this position for __________ seconds. 4. Slowly lower your leg to the starting position. Repeat __________ times. Complete this exercise __________ times a day. Squats This exercise strengthens the muscles in front of your thigh and knee  (quadriceps). 1. Stand in front of a table, with your feet and knees pointing straight ahead. You may rest your hands on the table for balance but not for support. 2. Slowly bend your knees and lower your hips like you are going to sit in a chair. ? Keep your weight over your heels, not over your toes. ? Keep your lower legs upright so they are parallel with the table legs. ? Do not let your hips go lower than your knees. ? Do not bend lower than told by your health care provider. ? If your knee pain increases, do not bend as low. 3. Hold the squat position for __________ seconds. 4. Slowly push with your legs to return to standing. Do not use your hands to pull yourself to standing. Repeat __________ times. Complete this exercise __________ times a day. Wall slides This exercise strengthens the muscles in front of your thigh and knee (quadriceps). 1. Lean your back against a smooth wall or door, and walk your feet out 18-24 inches (46-61 cm) from it. 2. Place your feet hip-width apart. 3.   Slowly slide down the wall or door until your knees bend __________ degrees. Keep your knees over your heels, not over your toes. Keep your knees in line with your hips. 4. Hold this position for __________ seconds. Repeat __________ times. Complete this exercise __________ times a day. Straight leg raises This exercise strengthens the muscles that rotate the leg at the hip and move it away from your body (hip abductors). 1. Lie on your side with your left / right leg in the top position. Lie so your head, shoulder, knee, and hip line up. You may bend your bottom knee to help you keep your balance. 2. Roll your hips slightly forward so your hips are stacked directly over each other and your left / right knee is facing forward. 3. Leading with your heel, lift your top leg 4-6 inches (10-15 cm). You should feel the muscles in your outer hip lifting. ? Do not let your foot drift forward. ? Do not let your knee  roll toward the ceiling. 4. Hold this position for __________ seconds. 5. Slowly return your leg to the starting position. 6. Let your muscles relax completely after each repetition. Repeat __________ times. Complete this exercise __________ times a day. Straight leg raises This exercise stretches the muscles that move your hips away from the front of the pelvis (hip extensors). 1. Lie on your abdomen on a firm surface. You can put a pillow under your hips if that is more comfortable. 2. Tense the muscles in your buttocks and lift your left / right leg about 4-6 inches (10-15 cm). Keep your knee straight as you lift your leg. 3. Hold this position for __________ seconds. 4. Slowly lower your leg to the starting position. 5. Let your leg relax completely after each repetition. Repeat __________ times. Complete this exercise __________ times a day. This information is not intended to replace advice given to you by your health care provider. Make sure you discuss any questions you have with your health care provider. Document Revised: 11/07/2017 Document Reviewed: 11/07/2017 Elsevier Patient Education  2020 [employer]  

## 2019-11-07 NOTE — Addendum Note (Signed)
Addended by: Taha Dimond, Sherrill Raring on: 11/07/2019 10:44 AM   Modules accepted: Orders

## 2019-11-07 NOTE — Telephone Encounter (Signed)
Called to inform patient knee exercises is in her MYChart.

## 2020-03-04 ENCOUNTER — Encounter: Payer: BC Managed Care – PPO | Admitting: Family Medicine

## 2020-07-09 ENCOUNTER — Other Ambulatory Visit: Payer: Self-pay | Admitting: Family Medicine

## 2020-07-09 DIAGNOSIS — Z1231 Encounter for screening mammogram for malignant neoplasm of breast: Secondary | ICD-10-CM

## 2020-07-22 ENCOUNTER — Other Ambulatory Visit: Payer: Self-pay

## 2020-07-22 ENCOUNTER — Other Ambulatory Visit: Payer: Self-pay | Admitting: Family Medicine

## 2020-07-22 ENCOUNTER — Ambulatory Visit: Admission: RE | Admit: 2020-07-22 | Payer: Self-pay | Source: Ambulatory Visit

## 2020-07-22 DIAGNOSIS — N6321 Unspecified lump in the left breast, upper outer quadrant: Secondary | ICD-10-CM

## 2020-08-05 ENCOUNTER — Other Ambulatory Visit: Payer: Self-pay

## 2020-08-05 ENCOUNTER — Ambulatory Visit
Admission: RE | Admit: 2020-08-05 | Discharge: 2020-08-05 | Disposition: A | Discharge status: Home / Self Care | Payer: BC Managed Care – PPO | Source: Ambulatory Visit | Attending: Family Medicine | Admitting: Family Medicine

## 2020-08-05 DIAGNOSIS — N6321 Unspecified lump in the left breast, upper outer quadrant: Secondary | ICD-10-CM | POA: Diagnosis not present

## 2020-08-06 ENCOUNTER — Other Ambulatory Visit: Payer: Self-pay | Admitting: Family Medicine

## 2020-08-06 DIAGNOSIS — R928 Other abnormal and inconclusive findings on diagnostic imaging of breast: Secondary | ICD-10-CM

## 2020-08-06 DIAGNOSIS — N6002 Solitary cyst of left breast: Secondary | ICD-10-CM

## 2020-11-24 ENCOUNTER — Ambulatory Visit: Payer: BC Managed Care – PPO | Admitting: Family Medicine

## 2020-12-02 ENCOUNTER — Ambulatory Visit (INDEPENDENT_AMBULATORY_CARE_PROVIDER_SITE_OTHER): Payer: BC Managed Care – PPO | Admitting: Family Medicine

## 2020-12-02 ENCOUNTER — Other Ambulatory Visit: Payer: Self-pay

## 2020-12-02 ENCOUNTER — Encounter: Payer: Self-pay | Admitting: Family Medicine

## 2020-12-02 VITALS — BP 114/70 | HR 84 | Temp 98.1°F | Resp 16 | Ht 63.0 in | Wt 142.1 lb

## 2020-12-02 DIAGNOSIS — Z1159 Encounter for screening for other viral diseases: Secondary | ICD-10-CM

## 2020-12-02 DIAGNOSIS — N951 Menopausal and female climacteric states: Secondary | ICD-10-CM | POA: Diagnosis not present

## 2020-12-02 DIAGNOSIS — N926 Irregular menstruation, unspecified: Secondary | ICD-10-CM | POA: Diagnosis not present

## 2020-12-02 DIAGNOSIS — M25562 Pain in left knee: Secondary | ICD-10-CM

## 2020-12-02 DIAGNOSIS — Z114 Encounter for screening for human immunodeficiency virus [HIV]: Secondary | ICD-10-CM | POA: Diagnosis not present

## 2020-12-02 DIAGNOSIS — G8929 Other chronic pain: Secondary | ICD-10-CM

## 2020-12-02 MED ORDER — DICLOFENAC SODIUM 1 % EX GEL
2.0000 g | Freq: Four times a day (QID) | CUTANEOUS | 1 refills | Status: DC
Start: 2020-12-02 — End: 2022-05-20

## 2020-12-02 NOTE — Progress Notes (Signed)
Name: Tanya Sawyer   MRN: [id number]    DOB: 11/15/1970   Date:12/02/2020       Progress Note  Subjective  Chief Complaint  Discuss Female Issue  HPI  Irregular cycles: she states in September she had a total of 3 periods , it was not heavy. LMP was 10/16 and shorter than usual, only lasted 2 days instead of 4 days. She is married, same sexual partner, no vaginal discharge, post-coital bleeding or pain with intercourse. No hair loss or weight gain. No change in bowel movements. Explained it may secondary to perimenopausal, discussed checking labs but she asked to hold off until her CPE   Left knee pain: she fell on her left knee about 4 years ago, since than has intermittent stiffness and anterior knee pain. She states symptoms aggravated by cold temperatures, using stairs, and also with increase in activity. No effusion , redness or increase in warmth. She states massage and sleeve or ice improve symptoms. Pain is not daily .    Patient Active Problem List   Diagnosis Date Noted   Dysthymia 11/07/2019   Eczema 11/07/2019   Symptomatic spider varicose vein 09/14/2017   GERD (gastroesophageal reflux disease) 09/14/2017    Past Surgical History:  Procedure Laterality Date   CESAREAN SECTION  1994, 1998    Family History  Problem Relation Age of Onset   Neuropathy Father    Hip fracture Paternal Grandmother    Breast cancer Paternal Aunt 55    Social History   Tobacco Use   Smoking status: Never   Smokeless tobacco: Never  Substance Use Topics   Alcohol use: No     Current Outpatient Medications:    Multiple Vitamins-Minerals (MULTI-VITAMIN GUMMIES) CHEW, Chew 1 each by mouth daily. Olly Gummy Multi-Vitamins, Disp: , Rfl:    diclofenac Sodium (VOLTAREN) 1 % GEL, Apply 2 g topically 4 (four) times daily., Disp: 100 g, Rfl: 1  Allergies  Allergen Reactions   Penicillins     As a child    I personally reviewed active problem list, medication list, allergies, family  history, social history, health maintenance with the patient/caregiver today.   ROS  Constitutional: Negative for fever or weight change.  Respiratory: Negative for cough and shortness of breath.   Cardiovascular: Negative for chest pain or palpitations.  Gastrointestinal: Negative for abdominal pain, no bowel changes.  Musculoskeletal: Negative for gait problem or joint swelling.  Skin: Negative for rash.  Neurological: Negative for dizziness or headache.  No other specific complaints in a complete review of systems (except as listed in HPI above).   Objective  Vitals:   12/02/20 0935  BP: 114/70  Pulse: 84  Resp: 16  Temp: 98.1 F (36.7 C)  TempSrc: Oral  SpO2: 99%  Weight: 142 lb 1.6 oz (64.5 kg)  Height: 5\' 3"  (1.6 m)    Body mass index is 25.17 kg/m.  Physical Exam  Constitutional: Patient appears well-developed and well-nourished.  No distress.  HEENT: head atraumatic, normocephalic, pupils equal and reactive to light, neck supple Cardiovascular: Normal rate, regular rhythm and normal heart sounds.  No murmur heard. No BLE edema. Pulmonary/Chest: Effort normal and breath sounds normal. No respiratory distress. Abdominal: Soft.  There is no tenderness. Muscular Skeletal: no effusion, normal rom of knee Psychiatric: Patient has a normal mood and affect. behavior is normal. Judgment and thought content normal.   PHQ2/9: Depression screen [facility] 2/9 12/02/2020 11/07/2019 05/08/2019 01/17/2019 04/12/2018  Decreased Interest 0 0 0 0  1  Down, Depressed, Hopeless 0 0 0 0 1  PHQ - 2 Score 0 0 0 0 2  Altered sleeping 0 - 0 1 0  Tired, decreased energy 0 - 0 0 0  Change in appetite 0 - 0 0 0  Feeling bad or failure about yourself  0 - 0 0 0  Trouble concentrating 0 - 0 1 1  Moving slowly or fidgety/restless 0 - 0 0 0  Suicidal thoughts 0 - 0 0 0  PHQ-9 Score 0 - 0 2 3  Difficult doing work/chores Not difficult at all - - Not difficult at all Not difficult at all    phq 9  is negative   Fall Risk: Fall Risk  12/02/2020 11/07/2019 05/08/2019 01/17/2019 04/12/2018  Falls in the past year? 0 0 0 0 0  Number falls in past yr: 0 0 0 0 0  Injury with Fall? 0 0 0 0 0  Risk for fall due to : No Fall Risks - - - -  Follow up Falls prevention discussed - - - -      Functional Status Survey: Is the patient deaf or have difficulty hearing?: No Does the patient have difficulty seeing, even when wearing glasses/contacts?: No Does the patient have difficulty concentrating, remembering, or making decisions?: No Does the patient have difficulty walking or climbing stairs?: Yes Does the patient have difficulty dressing or bathing?: No Does the patient have difficulty doing errands alone such as visiting a doctor's office or shopping?: No    Assessment & Plan  1. Irregular menstrual cycle  Likely secondary to peri menopause but we will check labs on her CPE  2. Need for hepatitis C screening test  Next visit   3. Encounter for screening for HIV   4. Need for shingles vaccine  Refused due to cost, sending to local pharmacy   5. Peri-menopause   6. Chronic pain of left knee  - diclofenac Sodium (VOLTAREN) 1 % GEL; Apply 2 g topically 4 (four) times daily.  Dispense: 100 g; Refill: 1

## 2021-07-22 NOTE — Patient Instructions (Incomplete)
Preventive Care 40-52 Years Old, Female Preventive care refers to lifestyle choices and visits with your health care provider that can promote health and wellness. Preventive care visits are also called wellness exams. What can I expect for my preventive care visit? Counseling Your health care provider may ask you questions about your: Medical history, including: Past medical problems. Family medical history. Pregnancy history. Current health, including: Menstrual cycle. Method of birth control. Emotional well-being. Home life and relationship well-being. Sexual activity and sexual health. Lifestyle, including: Alcohol, nicotine or tobacco, and drug use. Access to firearms. Diet, exercise, and sleep habits. Work and work environment. Sunscreen use. Safety issues such as seatbelt and bike helmet use. Physical exam Your health care provider will check your: Height and weight. These may be used to calculate your BMI (body mass index). BMI is a measurement that tells if you are at a healthy weight. Waist circumference. This measures the distance around your waistline. This measurement also tells if you are at a healthy weight and may help predict your risk of certain diseases, such as type 2 diabetes and high blood pressure. Heart rate and blood pressure. Body temperature. Skin for abnormal spots. What immunizations do I need?  Vaccines are usually given at various ages, according to a schedule. Your health care provider will recommend vaccines for you based on your age, medical history, and lifestyle or other factors, such as travel or where you work. What tests do I need? Screening Your health care provider may recommend screening tests for certain conditions. This may include: Lipid and cholesterol levels. Diabetes screening. This is done by checking your blood sugar (glucose) after you have not eaten for a while (fasting). Pelvic exam and Pap test. Hepatitis B test. Hepatitis C  test. HIV (human immunodeficiency virus) test. STI (sexually transmitted infection) testing, if you are at risk. Lung cancer screening. Colorectal cancer screening. Mammogram. Talk with your health care provider about when you should start having regular mammograms. This may depend on whether you have a family history of breast cancer. BRCA-related cancer screening. This may be done if you have a family history of breast, ovarian, tubal, or peritoneal cancers. Bone density scan. This is done to screen for osteoporosis. Talk with your health care provider about your test results, treatment options, and if necessary, the need for more tests. Follow these instructions at home: Eating and drinking  Eat a diet that includes fresh fruits and vegetables, whole grains, lean protein, and low-fat dairy products. Take vitamin and mineral supplements as recommended by your health care provider. Do not drink alcohol if: Your health care provider tells you not to drink. You are pregnant, may be pregnant, or are planning to become pregnant. If you drink alcohol: Limit how much you have to 0-1 drink a day. Know how much alcohol is in your drink. In the [place] one drink equals one 12 oz bottle of beer (355 mL), one 5 oz glass of wine (148 mL), or one 1 oz glass of hard liquor (44 mL). Lifestyle Brush your teeth every morning and night with fluoride toothpaste. Floss one time each day. Exercise for at least 30 minutes 5 or more days each week. Do not use any products that contain nicotine or tobacco. These products include cigarettes, chewing tobacco, and vaping devices, such as e-cigarettes. If you need help quitting, ask your health care provider. Do not use drugs. If you are sexually active, practice safe sex. Use a condom or other form of protection to   prevent STIs. If you do not wish to become pregnant, use a form of birth control. If you plan to become pregnant, see your health care provider for a  prepregnancy visit. Take aspirin only as told by your health care provider. Make sure that you understand how much to take and what form to take. Work with your health care provider to find out whether it is safe and beneficial for you to take aspirin daily. Find healthy ways to manage stress, such as: Meditation, yoga, or listening to music. Journaling. Talking to a trusted person. Spending time with friends and family. Minimize exposure to UV radiation to reduce your risk of skin cancer. Safety Always wear your seat belt while driving or riding in a vehicle. Do not drive: If you have been drinking alcohol. Do not ride with someone who has been drinking. When you are tired or distracted. While texting. If you have been using any mind-altering substances or drugs. Wear a helmet and other protective equipment during sports activities. If you have firearms in your house, make sure you follow all gun safety procedures. Seek help if you have been physically or sexually abused. What's next? Visit your health care provider once a year for an annual wellness visit. Ask your health care provider how often you should have your eyes and teeth checked. Stay up to date on all vaccines. This information is not intended to replace advice given to you by your health care provider. Make sure you discuss any questions you have with your health care provider. Document Revised: 07/15/2020 Document Reviewed: 07/15/2020 Elsevier Patient Education  [place].

## 2021-07-22 NOTE — Progress Notes (Unsigned)
Name: Markeia Harkless   MRN: [id number]    DOB: [birth year]   Date:07/22/2021       Progress Note  Subjective  Chief Complaint  Annual Exam  HPI  Patient presents for annual CPE.  Diet: *** Exercise: ***   Flowsheet Row Office Visit from 05/08/2019 in [facility]  AUDIT-C Score 0      Depression: Phq 9 is  {Desc; negative/positive:13464}    12/02/2020    9:35 AM 11/07/2019    9:43 AM 05/08/2019    9:26 AM 01/17/2019    9:37 AM 04/12/2018   11:18 AM  Depression screen PHQ 2/9  Decreased Interest 0 0 0 0 1  Down, Depressed, Hopeless 0 0 0 0 1  PHQ - 2 Score 0 0 0 0 2  Altered sleeping 0  0 1 0  Tired, decreased energy 0  0 0 0  Change in appetite 0  0 0 0  Feeling bad or failure about yourself  0  0 0 0  Trouble concentrating 0  0 1 1  Moving slowly or fidgety/restless 0  0 0 0  Suicidal thoughts 0  0 0 0  PHQ-9 Score 0  0 2 3  Difficult doing work/chores Not difficult at all   Not difficult at all Not difficult at all   Hypertension: BP Readings from Last 3 Encounters:  12/02/20 114/70  11/07/19 130/80  05/08/19 120/80   Obesity: Wt Readings from Last 3 Encounters:  12/02/20 142 lb 1.6 oz (64.5 kg)  11/07/19 137 lb 11.2 oz (62.5 kg)  05/08/19 137 lb 1.6 oz (62.2 kg)   BMI Readings from Last 3 Encounters:  12/02/20 25.17 kg/m  11/07/19 24.78 kg/m  05/08/19 25.49 kg/m     Vaccines:   HPV: N/A Tdap: up to date Shingrix: N/A Pneumonia: N/A Flu: N/A COVID-19: 1 of 2 Victoria Vera 2022   Hep C Screening: N/A STD testing and prevention (HIV/chl/gon/syphilis): 12/02/20 Intimate partner violence: negative screen  Sexual History : Menstrual History/LMP/Abnormal Bleeding:  Discussed importance of follow up if any post-menopausal bleeding: {Response; yes/no/na:63}  Incontinence Symptoms: {Desc; negative/positive:13464} for symptoms   Breast cancer:  - Last Mammogram: 08/05/20 - BRCA gene screening:   Osteoporosis Prevention : Discussed high  calcium and vitamin D supplementation, weight bearing exercises Bone density :not applicable   Cervical cancer screening: 10/26/17  Skin cancer: Discussed monitoring for atypical lesions  Colorectal cancer: 04/24/19   Lung cancer:  Low Dose CT Chest recommended if Age 52-80 years, 20 pack-year currently smoking OR have quit w/in 15years. Patient does not qualify for screen   ECG: N/A  Advanced Care Planning: A voluntary discussion about advance care planning including the explanation and discussion of advance directives.  Discussed health care proxy and Living will, and the patient was able to identify a health care proxy as ***.  Patient does not have a living will and power of attorney of health care   Lipids: Lab Results  Component Value Date   CHOL 182 10/26/2017   Lab Results  Component Value Date   HDL 61 10/26/2017   Lab Results  Component Value Date   LDLCALC 103 (H) 10/26/2017   Lab Results  Component Value Date   TRIG 90 10/26/2017   Lab Results  Component Value Date   CHOLHDL 3.0 10/26/2017   No results found for: "LDLDIRECT"  Glucose: Glucose, Bld  Date Value Ref Range Status  10/26/2017 85 65 - 99 mg/dL Final  Comment:    .            Fasting reference interval .     Patient Active Problem List   Diagnosis Date Noted   Dysthymia 11/07/2019   Eczema 11/07/2019   Symptomatic spider varicose vein 09/14/2017   GERD (gastroesophageal reflux disease) 09/14/2017    Past Surgical History:  Procedure Laterality Date   CESAREAN SECTION  1994, 1998    Family History  Problem Relation Age of Onset   Neuropathy Father    Hip fracture Paternal Grandmother    Breast cancer Paternal Aunt 6    Social History   Socioeconomic History   Marital status: Married    Spouse name: Gulio   Number of children: 2   Years of education: Not on file   Highest education level: High school graduate  Occupational History   Not on file  Tobacco Use   Smoking  status: Never   Smokeless tobacco: Never  Vaping Use   Vaping Use: Never used  Substance and Sexual Activity   Alcohol use: No   Drug use: No   Sexual activity: Yes    Partners: Male    Birth control/protection: None  Other Topics Concern   Not on file  Social History Narrative   Married, she has two grown children that are still at home but they work   She is originally from [place], she moved here at age 45 by herself, for a better life She met her husband here, he is also from [place]. 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 Olly Gummy Multi-Vitamins, Disp: , Rfl:   Allergies  Allergen Reactions   Penicillins     As a child     ROS  ***  Objective  There were no vitals filed for this visit.  There is no height or weight on file to calculate BMI.  Physical Exam ***  No results found for this or any previous visit (from the past 2160 hour(s)).   Fall Risk:    12/02/2020    9:34 AM 11/07/2019    9:42 AM 05/08/2019    9:25 AM 01/17/2019    9:37 AM 04/12/2018   10:50 AM  Fall Risk   Falls in the past year? 0 0 0 0 0  Number falls in past yr: 0 0 0 0 0  Injury with Fall? 0 0 0 0 0  Risk for fall due to : No Fall Risks      Follow up Falls prevention discussed         Functional Status Survey:     Assessment & Plan  1. Well adult  exam ***   -USPSTF grade A and B recommendations reviewed with patient; age-appropriate recommendations, preventive care, screening tests, etc  discussed and encouraged; healthy living encouraged; see AVS for patient education given to patient -Discussed importance of 150 minutes of physical activity weekly, eat two servings of fish weekly, eat one serving of tree nuts ( cashews, pistachios, pecans, almonds.Marland Kitchen) every other day, eat 6 servings of fruit/vegetables daily and drink plenty of water and avoid sweet beverages.   -Reviewed Health Maintenance: Yes.

## 2021-07-23 ENCOUNTER — Encounter: Payer: Self-pay | Admitting: Family Medicine

## 2021-07-23 ENCOUNTER — Other Ambulatory Visit (HOSPITAL_COMMUNITY)
Admission: RE | Admit: 2021-07-23 | Discharge: 2021-07-23 | Disposition: A | Discharge status: Home / Self Care | Payer: BC Managed Care – PPO | Source: Ambulatory Visit | Attending: Family Medicine | Admitting: Family Medicine

## 2021-07-23 ENCOUNTER — Ambulatory Visit (INDEPENDENT_AMBULATORY_CARE_PROVIDER_SITE_OTHER): Payer: BC Managed Care – PPO | Admitting: Family Medicine

## 2021-07-23 VITALS — BP 110/70 | HR 86 | Resp 16 | Ht 61.0 in | Wt 135.0 lb

## 2021-07-23 DIAGNOSIS — N951 Menopausal and female climacteric states: Secondary | ICD-10-CM

## 2021-07-23 DIAGNOSIS — Z1231 Encounter for screening mammogram for malignant neoplasm of breast: Secondary | ICD-10-CM

## 2021-07-23 DIAGNOSIS — Z124 Encounter for screening for malignant neoplasm of cervix: Secondary | ICD-10-CM | POA: Insufficient documentation

## 2021-07-23 DIAGNOSIS — Z13220 Encounter for screening for lipoid disorders: Secondary | ICD-10-CM

## 2021-07-23 DIAGNOSIS — Z0000 Encounter for general adult medical examination without abnormal findings: Secondary | ICD-10-CM

## 2021-07-23 DIAGNOSIS — Z131 Encounter for screening for diabetes mellitus: Secondary | ICD-10-CM

## 2021-07-23 DIAGNOSIS — Z1159 Encounter for screening for other viral diseases: Secondary | ICD-10-CM

## 2021-07-23 DIAGNOSIS — N926 Irregular menstruation, unspecified: Secondary | ICD-10-CM

## 2021-07-23 DIAGNOSIS — Z23 Encounter for immunization: Secondary | ICD-10-CM

## 2021-07-23 DIAGNOSIS — Z1211 Encounter for screening for malignant neoplasm of colon: Secondary | ICD-10-CM

## 2021-07-23 MED ORDER — SHINGRIX 50 MCG/0.5ML IM SUSR: 0.5000 mL | Freq: Once | INTRAMUSCULAR | 0 refills | Status: AC

## 2021-07-24 LAB — COMPLETE METABOLIC PANEL WITH GFR
AG Ratio: 1.4 (calc) (ref 1.0–2.5)
ALT: 17 U/L (ref 6–29)
AST: 19 U/L (ref 10–35)
Albumin: 4.3 g/dL (ref 3.6–5.1)
Alkaline phosphatase (APISO): 91 U/L (ref 37–153)
BUN: 17 mg/dL (ref 7–25)
CO2: 28 mmol/L (ref 20–32)
Calcium: 9 mg/dL (ref 8.6–10.4)
Chloride: 104 mmol/L (ref 98–110)
Creat: 0.68 mg/dL (ref 0.50–1.03)
Globulin: 3 g/dL (calc) (ref 1.9–3.7)
Glucose, Bld: 94 mg/dL (ref 65–99)
Potassium: 4.1 mmol/L (ref 3.5–5.3)
Sodium: 140 mmol/L (ref 135–146)
Total Bilirubin: 0.3 mg/dL (ref 0.2–1.2)
Total Protein: 7.3 g/dL (ref 6.1–8.1)
eGFR: 105 mL/min/{1.73_m2} (ref >=60)

## 2021-07-24 LAB — CBC WITH DIFFERENTIAL/PLATELET
Absolute Monocytes: 528 cells/uL (ref 200–950)
Basophils Absolute: 92 cells/uL (ref 0–200)
Basophils Relative: 1.4 %
Eosinophils Absolute: 92 cells/uL (ref 15–500)
Eosinophils Relative: 1.4 %
HCT: 38.4 % (ref 35.0–45.0)
Hemoglobin: 12.9 g/dL (ref 11.7–15.5)
Lymphs Abs: 1921 cells/uL (ref 850–3900)
MCH: 31.9 pg (ref 27.0–33.0)
MCHC: 33.6 g/dL (ref 32.0–36.0)
MCV: 95 fL (ref 80.0–100.0)
MPV: 10.9 fL (ref 7.5–12.5)
Monocytes Relative: 8 %
Neutro Abs: 3967 cells/uL (ref 1500–7800)
Neutrophils Relative %: 60.1 %
Platelets: 390 10*3/uL (ref 140–400)
RBC: 4.04 10*6/uL (ref 3.80–5.10)
RDW: 12.7 % (ref 11.0–15.0)
Total Lymphocyte: 29.1 %
WBC: 6.6 10*3/uL (ref 3.8–10.8)

## 2021-07-24 LAB — LIPID PANEL
Cholesterol: 210 mg/dL — ABNORMAL HIGH (ref <200)
HDL: 68 mg/dL (ref >=50)
LDL Cholesterol (Calc): 114 mg/dL (calc) — ABNORMAL HIGH
Non-HDL Cholesterol (Calc): 142 mg/dL (calc) — ABNORMAL HIGH (ref <130)
Total CHOL/HDL Ratio: 3.1 (calc) (ref <5.0)
Triglycerides: 160 mg/dL — ABNORMAL HIGH (ref <150)

## 2021-07-24 LAB — HEMOGLOBIN A1C
Hgb A1c MFr Bld: 5.4 % of total Hgb (ref <5.7)
Mean Plasma Glucose: 108 mg/dL
eAG (mmol/L): 6 mmol/L

## 2021-07-27 LAB — CYTOLOGY - PAP
Comment: NEGATIVE
Diagnosis: NEGATIVE
High risk HPV: NEGATIVE

## 2021-09-22 ENCOUNTER — Ambulatory Visit
Admission: RE | Admit: 2021-09-22 | Discharge: 2021-09-22 | Disposition: A | Discharge status: Home / Self Care | Payer: BC Managed Care – PPO | Source: Ambulatory Visit | Attending: Family Medicine | Admitting: Family Medicine

## 2021-09-22 ENCOUNTER — Encounter: Payer: Self-pay | Admitting: Radiology

## 2021-09-22 DIAGNOSIS — Z0000 Encounter for general adult medical examination without abnormal findings: Secondary | ICD-10-CM

## 2021-09-22 DIAGNOSIS — Z1231 Encounter for screening mammogram for malignant neoplasm of breast: Secondary | ICD-10-CM | POA: Diagnosis present

## 2021-09-23 ENCOUNTER — Other Ambulatory Visit: Payer: Self-pay | Admitting: Family Medicine

## 2021-09-23 DIAGNOSIS — R928 Other abnormal and inconclusive findings on diagnostic imaging of breast: Secondary | ICD-10-CM

## 2021-09-29 ENCOUNTER — Ambulatory Visit
Admission: RE | Admit: 2021-09-29 | Discharge: 2021-09-29 | Disposition: A | Discharge status: Home / Self Care | Payer: BC Managed Care – PPO | Source: Ambulatory Visit | Attending: Family Medicine | Admitting: Family Medicine

## 2021-09-29 DIAGNOSIS — R928 Other abnormal and inconclusive findings on diagnostic imaging of breast: Secondary | ICD-10-CM | POA: Insufficient documentation

## 2021-09-30 ENCOUNTER — Other Ambulatory Visit: Payer: Self-pay | Admitting: Family Medicine

## 2021-09-30 DIAGNOSIS — R928 Other abnormal and inconclusive findings on diagnostic imaging of breast: Secondary | ICD-10-CM

## 2021-10-08 ENCOUNTER — Other Ambulatory Visit: Payer: BC Managed Care – PPO

## 2021-10-11 IMAGING — MG DIGITAL DIAGNOSTIC BILAT W/ TOMO W/ CAD
6 of 10 series · 6 of 30 positions shown · non-contrast
Comparison: Previous exam(s).

CLINICAL DATA: 50-year-old female complaining of a palpable mass in
the left breast.

EXAM:
DIGITAL DIAGNOSTIC BILATERAL MAMMOGRAM WITH TOMOSYNTHESIS AND CAD;
ULTRASOUND LEFT BREAST LIMITED
TECHNIQUE: Bilateral digital diagnostic mammography and breast tomosynthesis
was performed. The images were evaluated with computer-aided
detection.; Targeted ultrasound examination of the left breast was
performed

[R MLO synth-2D]
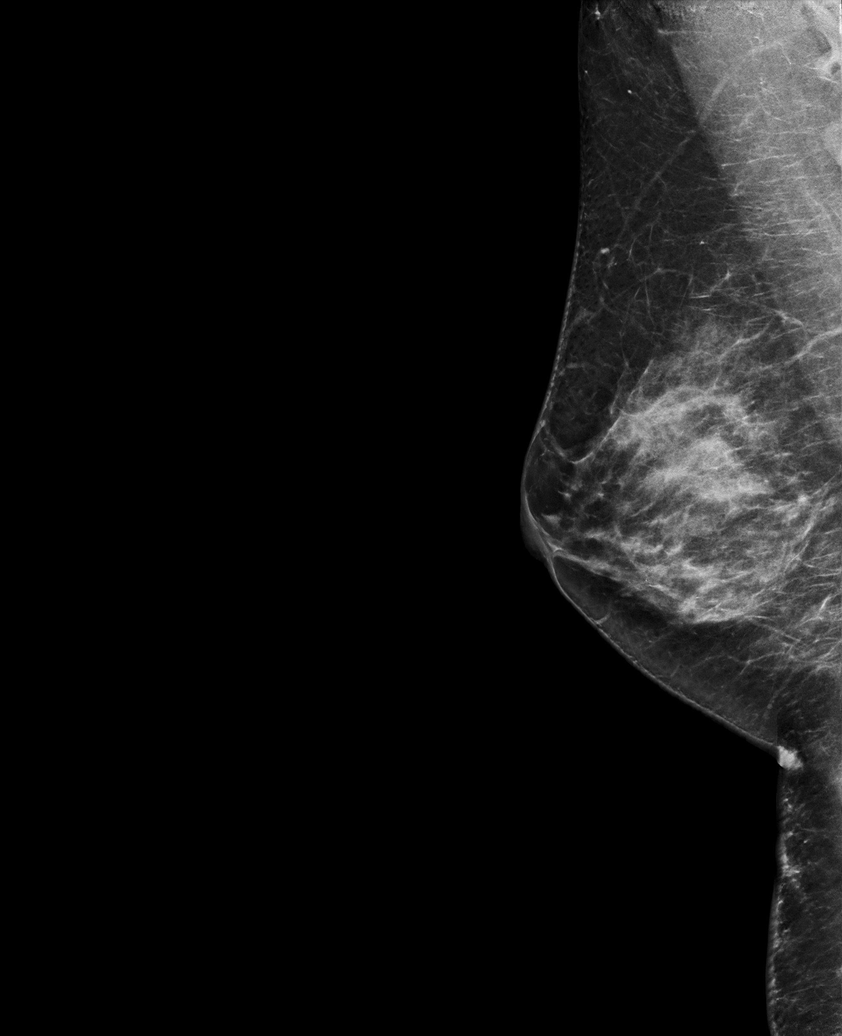

[L CC synth-2D]
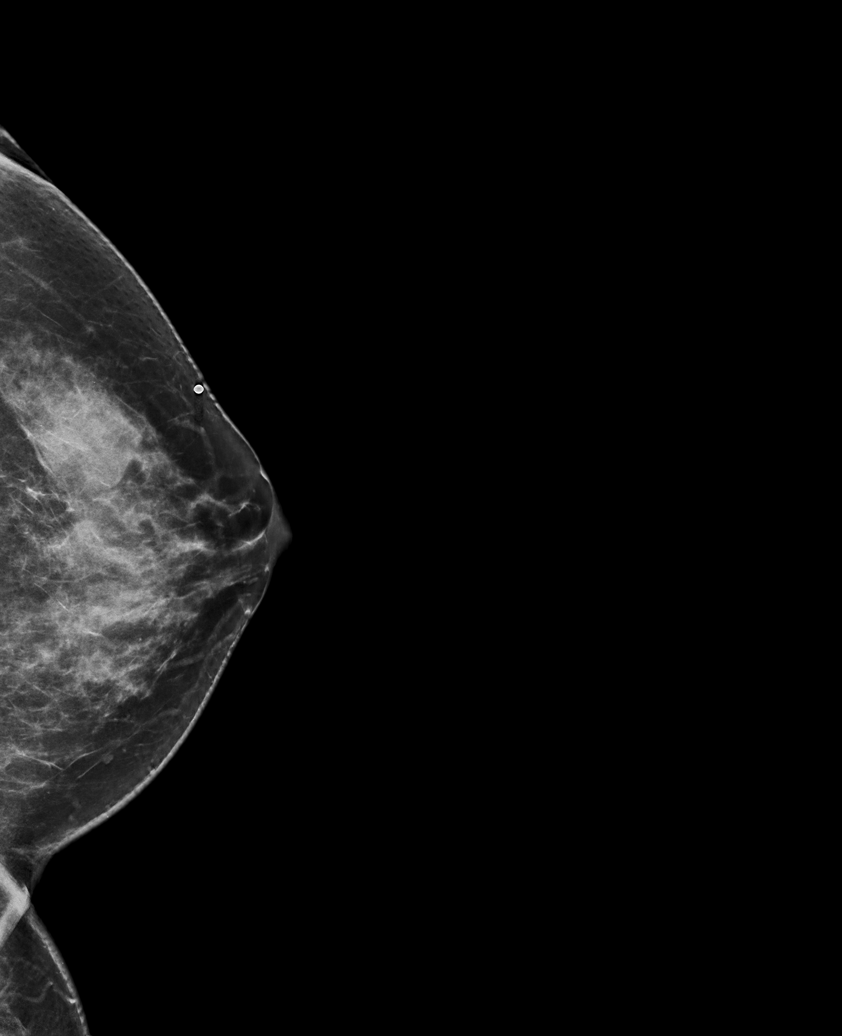

[R CC synth-2D]
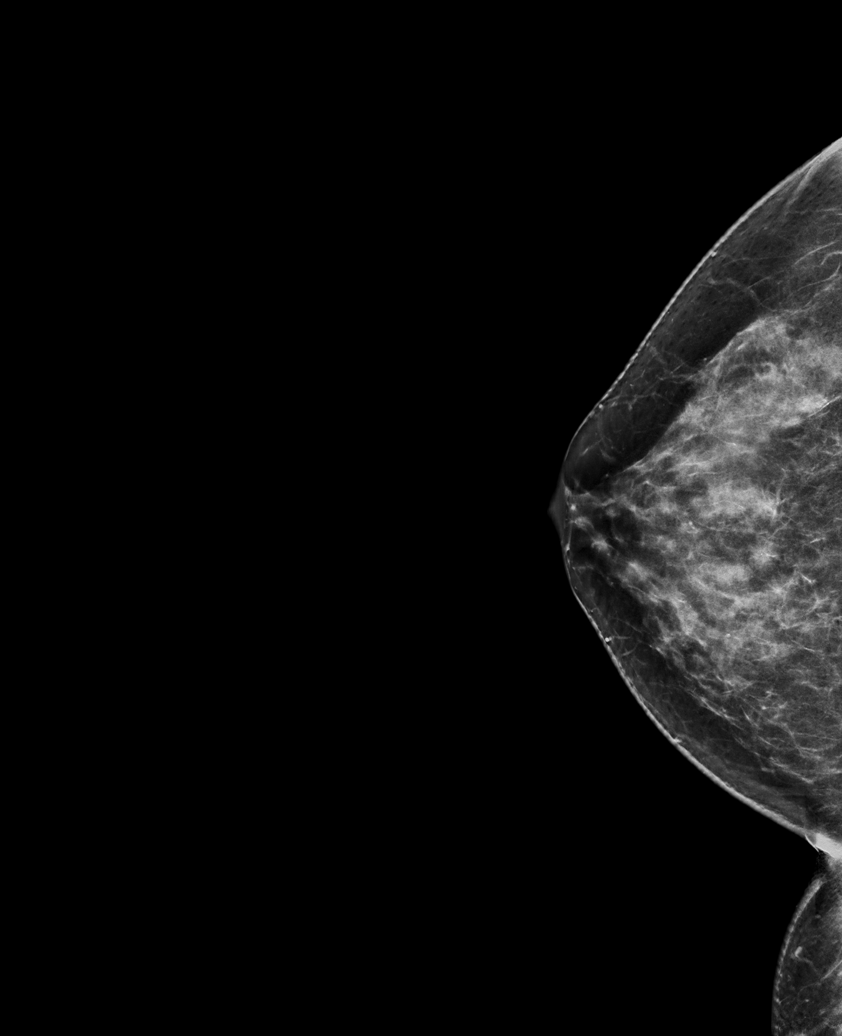

[L MLO synth-2D]
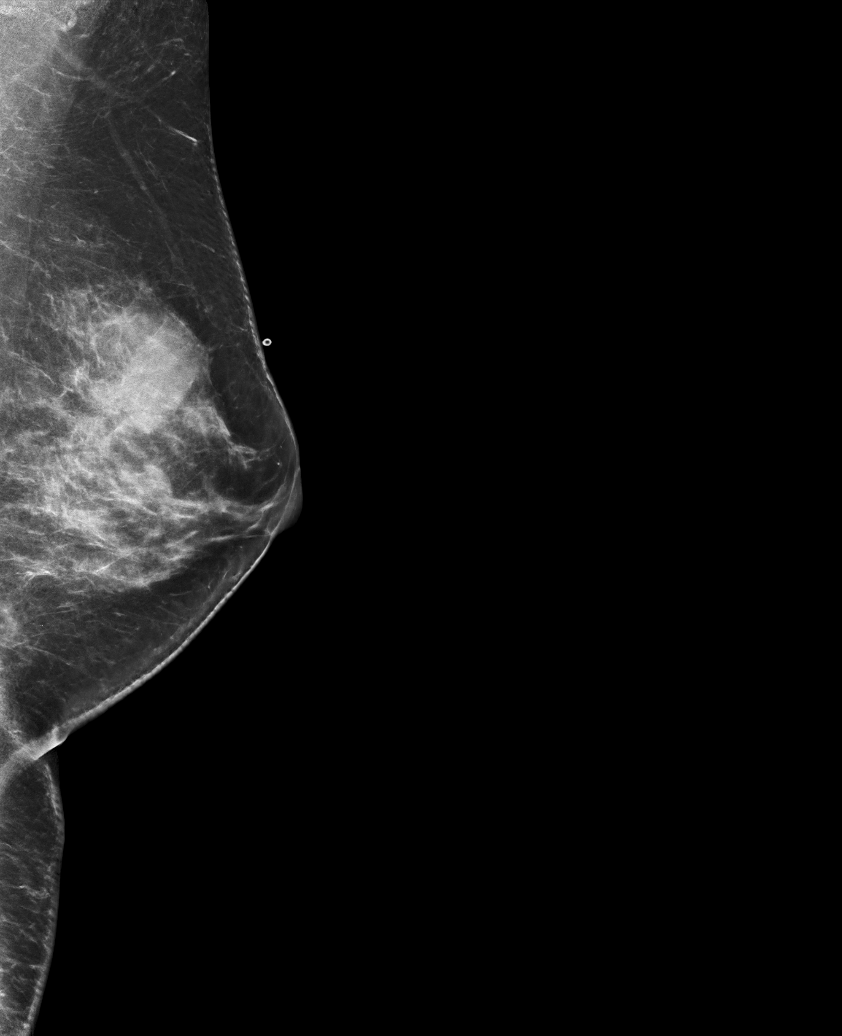

[L TAN synth-2D]
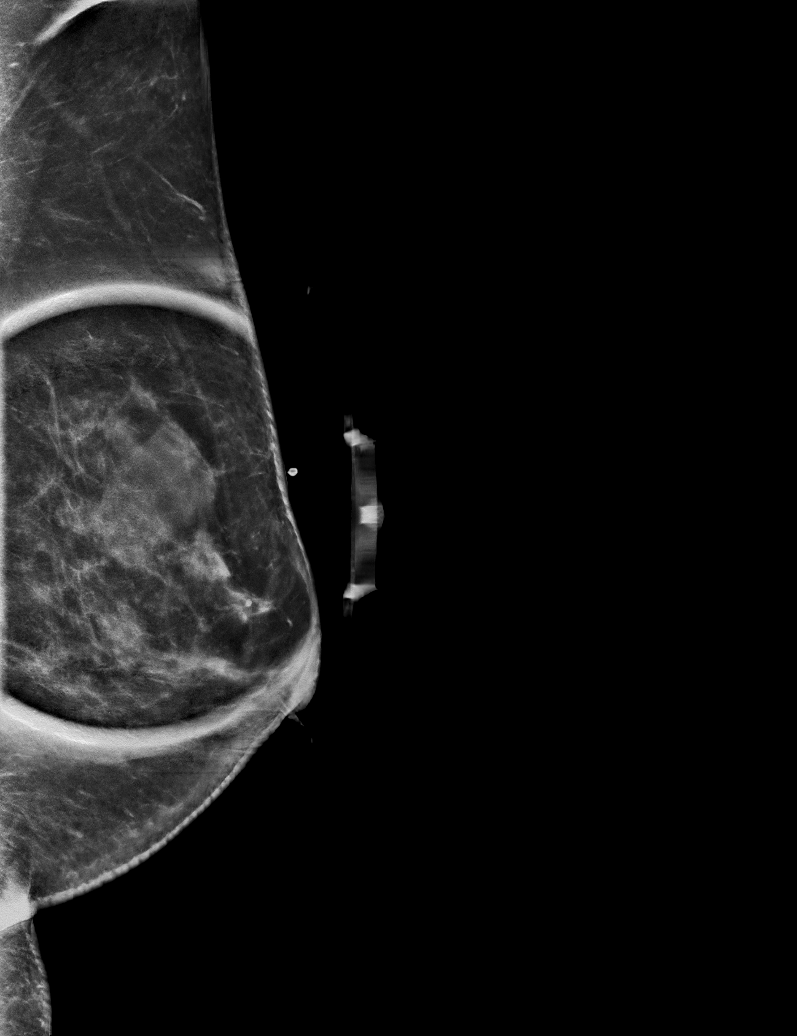

[L MLO tomo · tomo slice 38/75.0]
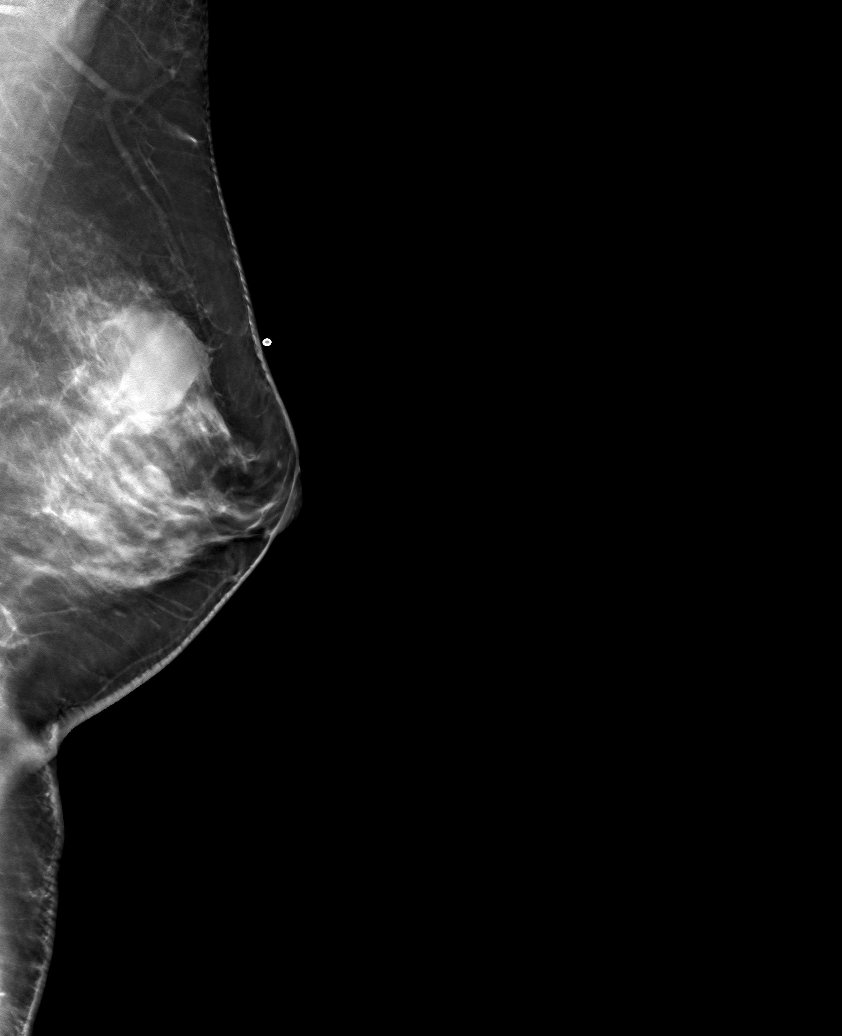

[6 of 30 positions shown; findings below may reference images not displayed]

ACR Breast Density Category c: The breast tissue is heterogeneously
dense, which may obscure small masses.
FINDINGS: No suspicious mass or malignant type microcalcifications identified
in the right breast.

There is a circumscribed 2.5 cm mass in the upper-outer quadrant of
left breast accounting for the palpable abnormality. No additional
mass or malignant type microcalcifications identified in the left
breast.

On physical exam, I palpate a discrete mass in the left breast at 1
o'clock 3 cm from the nipple.

Targeted ultrasound is performed, showing a well-circumscribed
anechoic cyst in the left breast at 1 o'clock 3 cm from the nipple
measuring 2.4 x 1.5 x 2.3 cm. There is an adjacent smaller anechoic
cyst at 12 o'clock 3 cm from the nipple measuring 9 x 6 x 9 mm.
IMPRESSION: Left breast cysts.  No evidence of malignancy in either breast.

RECOMMENDATION:
Bilateral screening mammogram in 1 year is recommended.

I have discussed the findings and recommendations with the patient.
If applicable, a reminder letter will be sent to the patient
regarding the next appointment.

BI-RADS CATEGORY  2: Benign.

## 2021-10-13 ENCOUNTER — Ambulatory Visit
Admission: RE | Admit: 2021-10-13 | Discharge: 2021-10-13 | Disposition: A | Discharge status: Home / Self Care | Payer: BC Managed Care – PPO | Source: Ambulatory Visit | Attending: Family Medicine | Admitting: Family Medicine

## 2021-10-13 DIAGNOSIS — R928 Other abnormal and inconclusive findings on diagnostic imaging of breast: Secondary | ICD-10-CM | POA: Insufficient documentation

## 2021-10-13 HISTORY — PX: BREAST BIOPSY: SHX20

## 2021-10-14 LAB — SURGICAL PATHOLOGY

## 2022-05-20 ENCOUNTER — Ambulatory Visit: Payer: BC Managed Care – PPO | Admitting: Family Medicine

## 2022-05-20 ENCOUNTER — Encounter: Payer: Self-pay | Admitting: Family Medicine

## 2022-05-20 VITALS — BP 126/70 | HR 82 | Resp 16 | Ht 63.0 in | Wt 141.0 lb

## 2022-05-20 DIAGNOSIS — R1312 Dysphagia, oropharyngeal phase: Secondary | ICD-10-CM

## 2022-05-20 DIAGNOSIS — R21 Rash and other nonspecific skin eruption: Secondary | ICD-10-CM | POA: Diagnosis not present

## 2022-05-20 MED ORDER — LORATADINE 10 MG PO TABS: 10.0000 mg | ORAL_TABLET | Freq: Every day | ORAL | 0 refills | Status: DC

## 2022-05-20 MED ORDER — NYSTATIN-TRIAMCINOLONE 100000-0.1 UNIT/GM-% EX OINT: 1.0000 | TOPICAL_OINTMENT | Freq: Two times a day (BID) | CUTANEOUS | 0 refills | Status: DC

## 2022-05-20 MED ORDER — FAMOTIDINE 20 MG PO TABS: 20.0000 mg | ORAL_TABLET | Freq: Every day | ORAL | 0 refills | Status: DC

## 2022-05-20 NOTE — Progress Notes (Signed)
Name: Tanya Sawyer   MRN: [id number]    DOB: 11-14-1970   Date:05/20/2022       Progress Note  Subjective  Chief Complaint  Itching/ Sore Throat  HPI  Neck Rash: she spend weeks in [place] it was hot and she developed an itchy rash on her neck, she returned to the [place] a couple of weeks ago and the rash is better but still present. It is localized to her anterior neck, no fever, chills, it is not spreading, she has tried lotion topically  Sore throat: she states noticed some discomfort while travelling, waking up with a sore throat and some dysphagia. No nausea, vomiting or change in appetite, she was eating spicier food while in [place] She does not like taking oral medications, but willing to try something otc and will return if no resolution of symptoms. She states symptoms are intermittent and no symptoms at this time   Patient Active Problem List   Diagnosis Date Noted   Dysthymia 11/07/2019   Eczema 11/07/2019   Symptomatic spider varicose vein 09/14/2017   GERD (gastroesophageal reflux disease) 09/14/2017    Past Surgical History:  Procedure Laterality Date   BREAST BIOPSY Right 10/13/2021   Stereo Bx, Coil Clip, path pending   CESAREAN SECTION  1994, 1998    Family History  Problem Relation Age of Onset   Neuropathy Father    Hip fracture Paternal Grandmother    Breast cancer Paternal Aunt 50    Social History   Tobacco Use   Smoking status: Never   Smokeless tobacco: Never  Substance Use Topics   Alcohol use: No     Current Outpatient Medications:    Multiple Vitamins-Minerals (MULTI-VITAMIN GUMMIES) CHEW, Chew 1 each by mouth daily. Olly Gummy Multi-Vitamins, Disp: , Rfl:   Allergies  Allergen Reactions   Penicillins     As a child    I personally reviewed active problem list, medication list, allergies, family history, social history, health maintenance with the patient/caregiver today.   ROS  Ten systems reviewed and is negative except  as mentioned in HPI   Objective  Vitals:   05/20/22 0817  BP: 126/70  Pulse: 82  Resp: 16  SpO2: 99%  Weight: 141 lb (64 kg)  Height:  (1.6 m)    Body mass index is 24.98 kg/m.  Physical Exam  Constitutional: Patient appears well-developed and well-nourished. No distress.  HEENT: head atraumatic, normocephalic, pupils equal and reactive to light, neck supple, throat within normal limits Cardiovascular: Normal rate, regular rhythm and normal heart sounds.  No murmur heard. No BLE edema. Pulmonary/Chest: Effort normal and breath sounds normal. No respiratory distress. Abdominal: Soft.  There is no tenderness. Skin: erythematous papules on anterior neck, no oozing.  Psychiatric: Patient has a normal mood and affect. behavior is normal. Judgment and thought content normal.   PHQ2/9:    05/20/2022    8:17 AM 07/23/2021    2:24 PM 12/02/2020    9:35 AM 11/07/2019    9:43 AM 05/08/2019    9:26 AM  Depression screen PHQ 2/9  Decreased Interest 0 2 0 0 0  Down, Depressed, Hopeless 0 1 0 0 0  PHQ - 2 Score 0 3 0 0 0  Altered sleeping 0 1 0  0  Tired, decreased energy 0 0 0  0  Change in appetite 0 0 0  0  Feeling bad or failure about yourself  0 0 0  0  Trouble  concentrating 0 0 0  0  Moving slowly or fidgety/restless 0 0 0  0  Suicidal thoughts 0 0 0  0  PHQ-9 Score 0 4 0  0  Difficult doing work/chores   Not difficult at all      phq 9 is negative   Fall Risk:    05/20/2022    8:17 AM 07/23/2021    2:24 PM 12/02/2020    9:34 AM 11/07/2019    9:42 AM 05/08/2019    9:25 AM  Fall Risk   Falls in the past year? 0 0 0 0 0  Number falls in past yr: 0 0 0 0 0  Injury with Fall? 0 0 0 0 0  Risk for fall due to : No Fall Risks No Fall Risks No Fall Risks    Follow up Falls prevention discussed Falls prevention discussed Falls prevention discussed        Functional Status Survey: Is the patient deaf or have difficulty hearing?: No Does the patient have difficulty  seeing, even when wearing glasses/contacts?: No Does the patient have difficulty concentrating, remembering, or making decisions?: No Does the patient have difficulty walking or climbing stairs?: No Does the patient have difficulty dressing or bathing?: No Does the patient have difficulty doing errands alone such as visiting a doctor's office or shopping?: No    Assessment & Plan  1. Rash of neck  - loratadine (CLARITIN) 10 MG tablet; Take 1 tablet (10 mg total) by mouth daily.  Dispense: 30 tablet; Refill: 0 - nystatin-triamcinolone ointment (MYCOLOG); Apply 1 Application topically 2 (two) times daily.  Dispense: 60 g; Refill: 0  2. Oropharyngeal dysphagia  - famotidine (PEPCID) 20 MG tablet; Take 1 tablet (20 mg total) by mouth at bedtime.  Dispense: 30 tablet; Refill: 0

## 2022-07-26 NOTE — Progress Notes (Deleted)
Name: Janina Trafton   MRN: [id number]    DOB: 17-Dec-1970   Date:07/26/2022       Progress Note  Subjective  Chief Complaint  Annual Exam  HPI  Patient presents for annual CPE.  Diet: *** Exercise: ***  Last Eye Exam: *** Last Dental Exam: ***  Flowsheet Row Office Visit from 05/08/2019 in [facility]  AUDIT-C Score 0      Depression: Phq 9 is  {Desc; negative/positive:13464}    05/20/2022    8:17 AM 07/23/2021    2:24 PM 12/02/2020    9:35 AM 11/07/2019    9:43 AM 05/08/2019    9:26 AM  Depression screen PHQ 2/9  Decreased Interest 0 2 0 0 0  Down, Depressed, Hopeless 0 1 0 0 0  PHQ - 2 Score 0 3 0 0 0  Altered sleeping 0 1 0  0  Tired, decreased energy 0 0 0  0  Change in appetite 0 0 0  0  Feeling bad or failure about yourself  0 0 0  0  Trouble concentrating 0 0 0  0  Moving slowly or fidgety/restless 0 0 0  0  Suicidal thoughts 0 0 0  0  PHQ-9 Score 0 4 0  0  Difficult doing work/chores   Not difficult at all     Hypertension: BP Readings from Last 3 Encounters:  05/20/22 126/70  07/23/21 110/70  12/02/20 114/70   Obesity: Wt Readings from Last 3 Encounters:  05/20/22 141 lb (64 kg)  07/23/21 135 lb (61.2 kg)  12/02/20 142 lb 1.6 oz (64.5 kg)   BMI Readings from Last 3 Encounters:  05/20/22 24.98 kg/m  07/23/21 25.51 kg/m  12/02/20 25.17 kg/m     Vaccines:   HPV: N/A Tdap: up to date Shingrix: 1 of 2 Pneumonia: N/A Flu: N/A COVID-19: up to date   Hep C Screening: N/A STD testing and prevention (HIV/chl/gon/syphilis): N/A Intimate partner violence: negative screen  Sexual History : Menstrual History/LMP/Abnormal Bleeding:  Discussed importance of follow up if any post-menopausal bleeding: yes  Incontinence Symptoms: negative for symptoms   Breast cancer:  - Last Mammogram: 09/22/21 - BRCA gene screening: N/A  Osteoporosis Prevention : Discussed high calcium and vitamin D supplementation, weight bearing  exercises Bone density: N/A  Cervical cancer screening: 07/23/21  Skin cancer: Discussed monitoring for atypical lesions  Colorectal cancer: 04/24/19   Lung cancer:  Low Dose CT Chest recommended if Age 44-80 years, 20 pack-year currently smoking OR have quit w/in 15years. Patient does not qualify for screen   ECG: N/A  Advanced Care Planning: A voluntary discussion about advance care planning including the explanation and discussion of advance directives.  Discussed health care proxy and Living will, and the patient was able to identify a health care proxy as ***.  Patient does not have a living will and power of attorney of health care   Lipids: Lab Results  Component Value Date   CHOL 210 (H) 07/23/2021   CHOL 182 10/26/2017   Lab Results  Component Value Date   HDL 68 07/23/2021   HDL 61 10/26/2017   Lab Results  Component Value Date   LDLCALC 114 (H) 07/23/2021   LDLCALC 103 (H) 10/26/2017   Lab Results  Component Value Date   TRIG 160 (H) 07/23/2021   TRIG 90 10/26/2017   Lab Results  Component Value Date   CHOLHDL 3.1 07/23/2021   CHOLHDL 3.0 10/26/2017   No  results found for: "LDLDIRECT"  Glucose: Glucose, Bld  Date Value Ref Range Status  07/23/2021 94 65 - 99 mg/dL Final    Comment:    .            Fasting reference interval .   10/26/2017 85 65 - 99 mg/dL Final    Comment:    .            Fasting reference interval .     Patient Active Problem List   Diagnosis Date Noted   Dysthymia 11/07/2019   Eczema 11/07/2019   Symptomatic spider varicose vein 09/14/2017   GERD (gastroesophageal reflux disease) 09/14/2017    Past Surgical History:  Procedure Laterality Date   BREAST BIOPSY Right 10/13/2021   Stereo Bx, Coil Clip, path pending   CESAREAN SECTION  1994, 1998    Family History  Problem Relation Age of Onset   Neuropathy Father    Hip fracture Paternal Grandmother    Breast cancer Paternal Aunt 71    Social History    Socioeconomic History   Marital status: Married    Spouse name: Gulio   Number of children: 2   Years of education: Not on file   Highest education level: High school graduate  Occupational History   Not on file  Tobacco Use   Smoking status: Never   Smokeless tobacco: Never  Vaping Use   Vaping Use: Never used  Substance and Sexual Activity   Alcohol use: No   Drug use: No   Sexual activity: Yes    Partners: Male    Birth control/protection: None  Other Topics Concern   Not on file  Social History Narrative   Married, she has two grown children that are still at home but they work   She is originally from [place] she moved here at age 76 by herself, for a better life She met her husband here, he is also from [place] 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    Chemical engineer Strain: Low Risk  (07/23/2021)   Overall Financial Resource Strain (CARDIA)    Difficulty of Paying Living Expenses: Not very hard  Food Insecurity: No Food Insecurity (07/23/2021)   Hunger Vital Sign    Worried About Running Out of Food in the Last Year: Never true    Ran Out of Food in the Last Year: Never true  Recent Concern: Food Insecurity - Food Insecurity Present (07/23/2021)   Hunger Vital Sign    Worried About Running Out of Food in the Last Year: Sometimes true    Ran Out of Food in the Last Year: Sometimes true  Transportation Needs: No Transportation Needs (07/23/2021)   PRAPARE - Administrator, Civil Service (Medical): No    Lack of Transportation (Non-Medical): No  Physical Activity: Sufficiently Active (07/23/2021)   Exercise Vital Sign    Days of Exercise per Week: 4 days    Minutes of Exercise per Session: 70 min  Stress: No Stress Concern Present (07/23/2021)   [employer] of Occupational Health - Occupational Stress Questionnaire    Feeling of Stress : Only a little  Social Connections: Socially  Integrated (07/23/2021)   Social Connection and Isolation Panel [NHANES]    Frequency of Communication with Friends and Family: More than three times a week    Frequency of Social Gatherings with Friends and Family: Three times a week  Attends Religious Services: More than 4 times per year    Active Member of Clubs or Organizations: Yes    Attends Banker Meetings: 1 to 4 times per year    Marital Status: Married  Catering manager Violence: Not At Risk (07/23/2021)   Humiliation, Afraid, Rape, and Kick questionnaire    Fear of Current or Ex-Partner: No    Emotionally Abused: No    Physically Abused: No    Sexually Abused: No     Current Outpatient Medications:    famotidine (PEPCID) 20 MG tablet, Take 1 tablet (20 mg total) by mouth at bedtime., Disp: 30 tablet, Rfl: 0   loratadine (CLARITIN) 10 MG tablet, Take 1 tablet (10 mg total) by mouth daily., Disp: 30 tablet, Rfl: 0   Multiple Vitamins-Minerals (MULTI-VITAMIN GUMMIES) CHEW, Chew 1 each by mouth daily. Olly Gummy Multi-Vitamins, Disp: , Rfl:    nystatin-triamcinolone ointment (MYCOLOG), Apply 1 Application topically 2 (two) times daily., Disp: 60 g, Rfl: 0  Allergies  Allergen Reactions   Penicillins     As a child     ROS  ***  Objective  There were no vitals filed for this visit.  There is no height or weight on file to calculate BMI.  Physical Exam ***  No results found for this or any previous visit (from the past 2160 hour(s)).   Fall Risk:    05/20/2022    8:17 AM 07/23/2021    2:24 PM 12/02/2020    9:34 AM 11/07/2019    9:42 AM 05/08/2019    9:25 AM  Fall Risk   Falls in the past year? 0 0 0 0 0  Number falls in past yr: 0 0 0 0 0  Injury with Fall? 0 0 0 0 0  Risk for fall due to : No Fall Risks No Fall Risks No Fall Risks    Follow up Falls prevention discussed Falls prevention discussed Falls prevention discussed       Functional Status Survey:     Assessment & Plan  1. Well  adult exam ***   -USPSTF grade A and B recommendations reviewed with patient; age-appropriate recommendations, preventive care, screening tests, etc discussed and encouraged; healthy living encouraged; see AVS for patient education given to patient -Discussed importance of 150 minutes of physical activity weekly, eat two servings of fish weekly, eat one serving of tree nuts ( cashews, pistachios, pecans, almonds.Marland Kitchen) every other day, eat 6 servings of fruit/vegetables daily and drink plenty of water and avoid sweet beverages.   -Reviewed Health Maintenance: Yes.

## 2022-07-27 ENCOUNTER — Encounter: Payer: BC Managed Care – PPO | Admitting: Family Medicine

## 2022-07-27 DIAGNOSIS — Z1159 Encounter for screening for other viral diseases: Secondary | ICD-10-CM

## 2022-07-27 DIAGNOSIS — Z1211 Encounter for screening for malignant neoplasm of colon: Secondary | ICD-10-CM

## 2022-07-27 DIAGNOSIS — Z0000 Encounter for general adult medical examination without abnormal findings: Secondary | ICD-10-CM

## 2022-07-27 DIAGNOSIS — Z113 Encounter for screening for infections with a predominantly sexual mode of transmission: Secondary | ICD-10-CM

## 2022-08-30 ENCOUNTER — Other Ambulatory Visit: Payer: Self-pay | Admitting: Family Medicine

## 2022-08-30 ENCOUNTER — Encounter: Payer: Self-pay | Admitting: Family Medicine

## 2022-08-30 DIAGNOSIS — Z1231 Encounter for screening mammogram for malignant neoplasm of breast: Secondary | ICD-10-CM

## 2022-09-28 ENCOUNTER — Ambulatory Visit
Admission: RE | Admit: 2022-09-28 | Discharge: 2022-09-28 | Disposition: A | Discharge status: Home / Self Care | Payer: BC Managed Care – PPO | Source: Ambulatory Visit | Attending: Family Medicine | Admitting: Family Medicine

## 2022-09-28 DIAGNOSIS — Z1231 Encounter for screening mammogram for malignant neoplasm of breast: Secondary | ICD-10-CM | POA: Diagnosis present

## 2022-10-11 NOTE — Progress Notes (Unsigned)
Name: Ryla Stovall   MRN: [id number]    DOB: 21-Feb-1970   Date:10/12/2022       Progress Note  Subjective  Chief Complaint  Annual Exam  HPI  Patient presents for annual CPE.  Diet: balanced  Exercise:  continue walking 25 minutes per day  Last Eye Exam: up to date  Last Dental Exam: she is due for a visit   Flowsheet Row Office Visit from [facility]/12/20[facility] in [facility]  AUDIT-C Score 0      Depression: Phq 9 is  negative    10/12/2022    8:13 AM 05/20/2022    8:17 AM 07/23/2021    2:24 PM 12/02/2020    9:35 AM 11/07/2019    9:43 AM  Depression screen PHQ 2/9  Decreased Interest 0 0 2 0 0  Down, Depressed, Hopeless 0 0 1 0 0  PHQ - 2 Score 0 0 3 0 0  Altered sleeping 1 0 1 0   Tired, decreased energy 0 0 0 0   Change in appetite 0 0 0 0   Feeling bad or failure about yourself  0 0 0 0   Trouble concentrating 0 0 0 0   Moving slowly or fidgety/restless 0 0 0 0   Suicidal thoughts 0 0 0 0   PHQ-9 Score 1 0 4 0   Difficult doing work/chores    Not difficult at all    Hypertension: BP Readings from Last 3 Encounters:  10/12/22 124/70  05/20/22 126/70  07/23/21 110/70   Obesity: Wt Readings from Last 3 Encounters:  10/12/22 138 lb (62.6 kg)  05/20/22 141 lb (64 kg)  07/23/21 135 lb (61.2 kg)   BMI Readings from Last 3 Encounters:  10/12/22 25.24 kg/m  05/20/22 24.98 kg/m  07/23/21 25.51 kg/m     Vaccines:   HPV: N/A Tdap: up to date Shingrix: 1 of 2 ,she will go to the pharmacy to get the second shot  Pneumonia: N/A Flu: refused  COVID-19: up to date   Hep C Screening: we will order the test today  STD testing and prevention (HIV/chl/gon/syphilis): N/A Intimate partner violence: negative screen  Sexual History : one partner  Menstrual History/LMP/Abnormal Bleeding: last cycle was in October 2023 , having a lot of hot flashes and night sweats, worried about hormonal replacement , discussed Effexor and clonidine at night but she  wants to think about it  Discussed importance of follow up if any post-menopausal bleeding: yes  Incontinence Symptoms: negative for symptoms   Breast cancer:  - Last Mammogram: 09/28/22 - BRCA gene screening: N/A  Osteoporosis Prevention : Discussed high calcium and vitamin D supplementation, weight bearing exercises Bone density: N/A   Cervical cancer screening: 07/23/21, repeat in 5 years   Skin cancer: Discussed monitoring for atypical lesions  Colorectal cancer: 04/24/19   Lung cancer:  Low Dose CT Chest recommended if Age 79-80 years, 20 pack-year currently smoking OR have quit w/in 15years. Patient does not qualify for screen   ECG: N/A  Advanced Care Planning: A voluntary discussion about advance care planning including the explanation and discussion of advance directives.  Discussed health care proxy and Living will, and the patient was able to identify a health care proxy as husband .  Patient does not have a living will and power of attorney of health care   Lipids: Lab Results  Component Value Date   CHOL 210 (H) 07/23/2021   CHOL 182 10/26/2017  Lab Results  Component Value Date   HDL 68 07/23/2021   HDL 61 10/26/2017   Lab Results  Component Value Date   LDLCALC 114 (H) 07/23/2021   LDLCALC 103 (H) 10/26/2017   Lab Results  Component Value Date   TRIG 160 (H) 07/23/2021   TRIG 90 10/26/2017   Lab Results  Component Value Date   CHOLHDL 3.1 07/23/2021   CHOLHDL 3.0 10/26/2017   No results found for: "LDLDIRECT"  Glucose: Glucose, Bld  Date Value Ref Range Status  07/23/2021 94 65 - 99 mg/dL Final    Comment:    .            Fasting reference interval .   10/26/2017 85 65 - 99 mg/dL Final    Comment:    .            Fasting reference interval .     Patient Active Problem List   Diagnosis Date Noted   Dysthymia 11/07/2019   Eczema 11/07/2019   Symptomatic spider varicose vein 09/14/2017   GERD (gastroesophageal reflux disease) 09/14/2017     Past Surgical History:  Procedure Laterality Date   BREAST BIOPSY Right 10/13/2021   Stereo Bx, Coil Clip, path pending   CESAREAN SECTION  1994, 1998    Family History  Problem Relation Age of Onset   Neuropathy Father    Hip fracture Paternal Grandmother    Breast cancer Paternal Aunt 14    Social History   Socioeconomic History   Marital status: Married    Spouse name: Gulio   Number of children: 2   Years of education: Not on file   Highest education level: High school graduate  Occupational History   Not on file  Tobacco Use   Smoking status: Never   Smokeless tobacco: Never  Vaping Use   Vaping status: Never Used  Substance and Sexual Activity   Alcohol use: No   Drug use: No   Sexual activity: Yes    Partners: Male    Birth control/protection: None  Other Topics Concern   Not on file  Social History Narrative   Married, she has two grown children that are still at home but they work   She is originally from [place] she moved here at age 60 by herself, for a better life She met her husband here, he is also from [place] 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    Chemical engineer Strain: Low Risk  (10/12/2022)   Overall Financial Resource Strain (CARDIA)    Difficulty of Paying Living Expenses: Not hard at all  Food Insecurity: No Food Insecurity (10/12/2022)   Hunger Vital Sign    Worried About Running Out of Food in the Last Year: Never true    Ran Out of Food in the Last Year: Never true  Transportation Needs: No Transportation Needs (10/12/2022)   PRAPARE - Administrator, Civil Service (Medical): No    Lack of Transportation (Non-Medical): No  Physical Activity: Insufficiently Active (10/12/2022)   Exercise Vital Sign    Days of Exercise per Week: 3 days    Minutes of Exercise per Session: 40 min  Stress: No Stress Concern Present (10/12/2022)   [employer] of Occupational  Health - Occupational Stress Questionnaire    Feeling of Stress : Not at all  Social Connections: Moderately Integrated (10/12/2022)   Social Connection and Isolation  Panel [NHANES]    Frequency of Communication with Friends and Family: Three times a week    Frequency of Social Gatherings with Friends and Family: Twice a week    Attends Religious Services: More than 4 times per year    Active Member of [employer] or Organizations: No    Attends Banker Meetings: Never    Marital Status: Married  Catering manager Violence: Not At Risk (10/12/2022)   Humiliation, Afraid, Rape, and Kick questionnaire    Fear of Current or Ex-Partner: No    Emotionally Abused: No    Physically Abused: No    Sexually Abused: No     Current Outpatient Medications:    Multiple Vitamins-Minerals (MULTI-VITAMIN GUMMIES) CHEW, Chew 1 each by mouth daily. Olly Gummy Multi-Vitamins, Disp: , Rfl:   Allergies  Allergen Reactions   Penicillins     As a child     ROS  Constitutional: Negative for fever or weight change.  Respiratory: Negative for cough and shortness of breath.   Cardiovascular: Negative for chest pain or palpitations.  Gastrointestinal: Negative for abdominal pain, no bowel changes.  Musculoskeletal: Negative for gait problem or joint swelling.  Skin: Negative for rash.  Neurological: Negative for dizziness or headache.  No other specific complaints in a complete review of systems (except as listed in HPI above).   Objective  Vitals:   10/12/22 0814  BP: 124/70  Pulse: 91  Resp: 16  SpO2: 98%  Weight: 138 lb (62.6 kg)  Height: 5\' 2"  (1.575 m)    Body mass index is 25.24 kg/m.  Physical Exam  Constitutional: Patient appears well-developed and well-nourished. No distress.  HENT: Head: Normocephalic and atraumatic. Ears: B TMs ok, no erythema or effusion; Nose: Nose normal. Mouth/Throat: Oropharynx is clear and moist. No oropharyngeal exudate.  Eyes: Conjunctivae and EOM  are normal. Pupils are equal, round, and reactive to light. No scleral icterus.  Neck: Normal range of motion. Neck supple. No JVD present. No thyromegaly present.  Cardiovascular: Normal rate, regular rhythm and normal heart sounds.  No murmur heard. No BLE edema. Pulmonary/Chest: Effort normal and breath sounds normal. No respiratory distress. Abdominal: Soft. Bowel sounds are normal, no distension. There is no tenderness. no masses Breast: no lumps or masses, no nipple discharge or rashes FEMALE GENITALIA:  Not done  RECTAL: not done  Musculoskeletal: Normal range of motion, no joint effusions. No gross deformities Neurological: he is alert and oriented to person, place, and time. No cranial nerve deficit. Coordination, balance, strength, speech and gait are normal.  Skin: Skin is warm and dry. No rash noted. No erythema.  Psychiatric: Patient has a normal mood and affect. behavior is normal. Judgment and thought content normal.    Fall Risk:    10/12/2022    8:13 AM 05/20/2022    8:17 AM 07/23/2021    2:24 PM 12/02/2020    9:34 AM 11/07/2019    9:42 AM  Fall Risk   Falls in the past year? 0 0 0 0 0  Number falls in past yr: 0 0 0 0 0  Injury with Fall? 0 0 0 0 0  Risk for fall due to : No Fall Risks No Fall Risks No Fall Risks No Fall Risks   Follow up Falls prevention discussed Falls prevention discussed Falls prevention discussed Falls prevention discussed      Functional Status Survey: Is the patient deaf or have difficulty hearing?: No Does the patient have difficulty seeing, even  when wearing glasses/contacts?: No Does the patient have difficulty concentrating, remembering, or making decisions?: No Does the patient have difficulty walking or climbing stairs?: No Does the patient have difficulty dressing or bathing?: No Does the patient have difficulty doing errands alone such as visiting a doctor's office or shopping?: No   Assessment & Plan  1. Well adult exam  -  Hepatitis C Antibody - Cologuard - Lipid panel - CBC with Differential/Platelet - COMPLETE METABOLIC PANEL WITH GFR - Hemoglobin A1c  2. Need for hepatitis C screening test  - Hepatitis C Antibody  3. Colon cancer screening  - Cologuard  4. Diabetes mellitus screening  - Hemoglobin A1c  5. Lipid screening  - Lipid panel  6. Perimenopausal vasomotor symptoms     -USPSTF grade A and B recommendations reviewed with patient; age-appropriate recommendations, preventive care, screening tests, etc discussed and encouraged; healthy living encouraged; see AVS for patient education given to patient -Discussed importance of 150 minutes of physical activity weekly, eat two servings of fish weekly, eat one serving of tree nuts ( cashews, pistachios, pecans, almonds.Marland Kitchen) every other day, eat 6 servings of fruit/vegetables daily and drink plenty of water and avoid sweet beverages.   -Reviewed Health Maintenance: Yes.

## 2022-10-12 ENCOUNTER — Ambulatory Visit (INDEPENDENT_AMBULATORY_CARE_PROVIDER_SITE_OTHER): Payer: BC Managed Care – PPO | Admitting: Family Medicine

## 2022-10-12 ENCOUNTER — Encounter: Payer: Self-pay | Admitting: Family Medicine

## 2022-10-12 VITALS — BP 124/70 | HR 91 | Resp 16 | Ht 62.0 in | Wt 138.0 lb

## 2022-10-12 DIAGNOSIS — Z1211 Encounter for screening for malignant neoplasm of colon: Secondary | ICD-10-CM | POA: Diagnosis not present

## 2022-10-12 DIAGNOSIS — Z131 Encounter for screening for diabetes mellitus: Secondary | ICD-10-CM

## 2022-10-12 DIAGNOSIS — Z0000 Encounter for general adult medical examination without abnormal findings: Secondary | ICD-10-CM

## 2022-10-12 DIAGNOSIS — Z1159 Encounter for screening for other viral diseases: Secondary | ICD-10-CM | POA: Diagnosis not present

## 2022-10-12 DIAGNOSIS — N951 Menopausal and female climacteric states: Secondary | ICD-10-CM

## 2022-10-12 DIAGNOSIS — Z13220 Encounter for screening for lipoid disorders: Secondary | ICD-10-CM

## 2022-10-13 LAB — LIPID PANEL
Cholesterol: 193 mg/dL (ref <200)
HDL: 68 mg/dL (ref >=50)
LDL Cholesterol (Calc): 102 mg/dL — ABNORMAL HIGH
Non-HDL Cholesterol (Calc): 125 mg/dL (ref <130)
Total CHOL/HDL Ratio: 2.8 (calc) (ref <5.0)
Triglycerides: 131 mg/dL (ref <150)

## 2022-10-13 LAB — COMPLETE METABOLIC PANEL WITH GFR
AG Ratio: 1.4 (calc) (ref 1.0–2.5)
ALT: 27 U/L (ref 6–29)
AST: 22 U/L (ref 10–35)
Albumin: 4.4 g/dL (ref 3.6–5.1)
Alkaline phosphatase (APISO): 90 U/L (ref 37–153)
BUN: 17 mg/dL (ref 7–25)
CO2: 29 mmol/L (ref 20–32)
Calcium: 9.7 mg/dL (ref 8.6–10.4)
Chloride: 104 mmol/L (ref 98–110)
Creat: 0.61 mg/dL (ref 0.50–1.03)
Globulin: 3.2 g/dL (ref 1.9–3.7)
Glucose, Bld: 79 mg/dL (ref 65–99)
Potassium: 4.5 mmol/L (ref 3.5–5.3)
Sodium: 139 mmol/L (ref 135–146)
Total Bilirubin: 0.4 mg/dL (ref 0.2–1.2)
Total Protein: 7.6 g/dL (ref 6.1–8.1)
eGFR: 107 mL/min/{1.73_m2} (ref >=60)

## 2022-10-13 LAB — CBC WITH DIFFERENTIAL/PLATELET
Absolute Monocytes: 343 {cells}/uL (ref 200–950)
Basophils Absolute: 51 {cells}/uL (ref 0–200)
Basophils Relative: 1.3 %
Eosinophils Absolute: 51 {cells}/uL (ref 15–500)
Eosinophils Relative: 1.3 %
HCT: 42.5 % (ref 35.0–45.0)
Hemoglobin: 14.2 g/dL (ref 11.7–15.5)
Lymphs Abs: 1443 {cells}/uL (ref 850–3900)
MCH: 31.3 pg (ref 27.0–33.0)
MCHC: 33.4 g/dL (ref 32.0–36.0)
MCV: 93.8 fL (ref 80.0–100.0)
MPV: 11 fL (ref 7.5–12.5)
Monocytes Relative: 8.8 %
Neutro Abs: 2012 {cells}/uL (ref 1500–7800)
Neutrophils Relative %: 51.6 %
Platelets: 369 10*3/uL (ref 140–400)
RBC: 4.53 10*6/uL (ref 3.80–5.10)
RDW: 12.1 % (ref 11.0–15.0)
Total Lymphocyte: 37 %
WBC: 3.9 10*3/uL (ref 3.8–10.8)

## 2022-10-13 LAB — HEMOGLOBIN A1C
Hgb A1c MFr Bld: 5.9 %{Hb} — ABNORMAL HIGH (ref <5.7)
Mean Plasma Glucose: 123 mg/dL
eAG (mmol/L): 6.8 mmol/L

## 2022-10-13 LAB — HEPATITIS C ANTIBODY: Hepatitis C Ab: NONREACTIVE

## 2022-11-02 ENCOUNTER — Ambulatory Visit: Payer: Self-pay | Admitting: *Deleted

## 2022-11-02 NOTE — Telephone Encounter (Signed)
  Chief Complaint: abdominal pain- comes and goes Symptoms: abdominal pain, one episode of dizziness, headache comes and goes Frequency:  started 2 weeks ago Pertinent Negatives: Patient denies back pain, diarrhea, fever, urination pain, vomiting  Disposition: [] ED /[] Urgent Care (no appt availability in office) / [x] Appointment(In office/virtual)/ []  [facility]/ [] Home Care/ [] Refused Recommended Disposition /[] [facility]/ []  Follow-up with PCP Additional Notes: First available appointment scheduled in office- patient declines to go to another office-  patient has been scheduled out of disposition- advised to be seen sooner if symptoms get worse/change.

## 2022-11-02 NOTE — Telephone Encounter (Signed)
Summary: dizzy + headaches + losing weight   Patient has called in, stating she is not feeling well. States she has been having constant headaches and feeling dizzy, also has been losing weight and is concerned. Patient preferred to see her PCP only but no availability with PCP, no other appt's at [facility]. Offered to schedule at [facility] today and patient states she can not do this appt.   Please contact patient back @ # [phone]       Reason for Disposition  [1] MODERATE pain (e.g., interferes with normal activities) AND [2] pain comes and goes (cramps) AND [3] present > 24 hours  (Exception: Pain with Vomiting or Diarrhea - see that Guideline.)  Answer Assessment - Initial Assessment Questions 1. LOCATION: "Where does it hurt?"      Middle-lower pain 2. RADIATION: "Does the pain shoot anywhere else?" (e.g., chest, back)     No radiation 3. ONSET: "When did the pain begin?" (e.g., minutes, hours or days ago)      2 weeks 4. SUDDEN: "Gradual or sudden onset?"     *No Answer* 5. PATTERN "Does the pain come and go, or is it constant?"    - If it comes and goes: "How long does it last?" "Do you have pain now?"     (Note: Comes and goes means the pain is intermittent. It goes away completely between bouts.)    - If constant: "Is it getting better, staying the same, or getting worse?"      (Note: Constant means the pain never goes away completely; most serious pain is constant and gets worse.)      Comes and goes- lasting few hours 6. SEVERITY: "How bad is the pain?"  (e.g., Scale 1-10; mild, moderate, or severe)    - MILD (1-3): Doesn't interfere with normal activities, abdomen soft and not tender to touch.     - MODERATE (4-7): Interferes with normal activities or awakens from sleep, abdomen tender to touch.     - SEVERE (8-10): Excruciating pain, doubled over, unable to do any normal activities.       moderate 7. RECURRENT SYMPTOM: "Have you ever had this type of stomach pain  before?" If Yes, ask: "When was the last time?" and "What happened that time?"      Yes- never evaluated 8. CAUSE: "What do you think is causing the stomach pain?"     unsure 9. RELIEVING/AGGRAVATING FACTORS: "What makes it better or worse?" (e.g., antacids, bending or twisting motion, bowel movement)     Has not taken anything for pain 10. OTHER SYMPTOMS: "Do you have any other symptoms?" (e.g., back pain, diarrhea, fever, urination pain, vomiting)       no  Answer Assessment - Initial Assessment Questions 1. DESCRIPTION: "Describe your dizziness."     Not 100% 2. LIGHTHEADED: "Do you feel lightheaded?" (e.g., somewhat faint, woozy, weak upon standing)     Feels not "right"  4. SEVERITY: "How bad is it?"  "Do you feel like you are going to faint?" "Can you stand and walk?"   - MILD: Feels slightly dizzy, but walking normally.   - MODERATE: Feels unsteady when walking, but not falling; interferes with normal activities (e.g., school, work).   - SEVERE: Unable to walk without falling, or requires assistance to walk without falling; feels like passing out now.      mild 5. ONSET:  "When did the dizziness begin?"     2 weeks- last Friday  10. OTHER SYMPTOMS: "Do you have any other symptoms?" (e.g., fever, chest pain, vomiting, diarrhea, bleeding)       Abdominal pain- comes and goes  Protocols used: Abdominal Pain - Female-A-AH, Dizziness - Lightheadedness-A-AH

## 2022-11-07 ENCOUNTER — Encounter: Payer: Self-pay | Admitting: Physician Assistant

## 2022-11-07 ENCOUNTER — Ambulatory Visit: Payer: BC Managed Care – PPO | Admitting: Physician Assistant

## 2022-11-07 VITALS — BP 114/72 | HR 92 | Temp 97.9°F | Resp 16 | Ht 62.0 in | Wt 133.5 lb

## 2022-11-07 DIAGNOSIS — R002 Palpitations: Secondary | ICD-10-CM | POA: Diagnosis not present

## 2022-11-07 DIAGNOSIS — R42 Dizziness and giddiness: Secondary | ICD-10-CM

## 2022-11-07 LAB — COLOGUARD: COLOGUARD: NEGATIVE

## 2022-11-07 NOTE — Patient Instructions (Addendum)
I suspect that your dizziness and stomach pain may be from your recent diet changes.  These changes are good for you to do to help manage your sugar and reduce cholesterol but they may have been enacted too quickly   I recommend that you make sure you are drinking plenty of water (at least 75 oz per day)  You can continue to cut out most carbohydrates if desired but I recommend reducing them and choosing whole grain options instead of completely taking them out (oatmeal, whole grain or wheat bread, brown rice)  Make sure you are eating plenty of protein to help feel full longer (chicken, fish, peanut butter, quinoa, farrow, tofu)   You can still have healthy fats such as avocado, olive oil, eggs, nuts- just try to reduce saturated fats   You can taking a medication called Pepcid once per day to help with stomach pains. This will prevent heartburn. You can take TUMS or Pepto as needed for flares of heartburn

## 2022-11-07 NOTE — Progress Notes (Unsigned)
Acute Office Visit   Patient: Tanya Sawyer   DOB: 1970-05-13   52 y.o. Female  MRN: [id number] Visit Date: 11/07/2022  Today's healthcare provider: Oswaldo Conroy Nery Kalisz, PA-C  Introduced myself to the patient as a Secondary school teacher and provided education on APPs in clinical practice.    Chief Complaint  Patient presents with   Abdominal Pain    No longer hurting   Dizziness    Since last Wednesday, pt states she changed her diet completely due to her previous labs   Subjective    HPI HPI     Abdominal Pain    Additional comments: No longer hurting        Dizziness    Additional comments: Since last Wednesday, pt states she changed her diet completely due to her previous labs      Last edited by Tanya Sawyer, CMA on 11/07/2022  2:35 PM.       She reports 11/02/22 she was not feeling good- she states she was having headache, dizziness, abdominal pain, low energy  She reports her abdominal pain and headache have resolved but she is still feeling a bit dizzy  She has recently changed her diet- she has cut out all carbs and has been eating more salads  Water intake: she has increased her water intake - thinks she is drinking 3-4 , 12 oz bottles per day  Diet: Today she reports she ate a small steak with 2 eggs, oatmeal with half a banana   She reports feeling better when she eats   DIZZINESS Duration: 2 weeks- improving  Description of symptoms: off kilter Duration of episode: minutes Dizziness frequency: no history of the same Provoking factors:  unsure  Aggravating factors:   unsure  Triggered by rolling over in bed: no Triggered by bending over: yes Aggravated by head movement: no Aggravated by exertion, coughing, loud noises: no Recent head injury: no Recent or current viral symptoms: no History of vasovagal episodes: no Nausea: no Vomiting: no Tinnitus: no Hearing loss: no Aural fullness: no Headache: yes- last week  Photophobia/phonophobia: yes-  photophobia  Unsteady gait: no Postural instability: no Diplopia, dysarthria, dysphagia or weakness: no Related to exertion: no Pallor: no Diaphoresis: yes- at night  Dyspnea: no Chest pain: no      Medications: Outpatient Medications Prior to Visit  Medication Sig   Multiple Vitamins-Minerals (MULTI-VITAMIN GUMMIES) CHEW Chew 1 each by mouth daily. Olly Gummy Multi-Vitamins   No facility-administered medications prior to visit.    Review of Systems  Cardiovascular:  Positive for palpitations.  Gastrointestinal:  Positive for abdominal pain.  Neurological:  Positive for dizziness, tremors, light-headedness and headaches.    {Insert previous labs (optional):23779} {See past labs  Heme  Chem  Endocrine  Serology  Results Review (optional):1}   Objective    BP 114/72   Pulse 92   Temp 97.9 F (36.6 C) (Oral)   Resp 16   Ht 5\' 2"  (1.575 m)   Wt 133 lb 8 oz (60.6 kg)   LMP 11/17/2021 (Approximate)   SpO2 99%   BMI 24.42 kg/m  {Insert last BP/Wt (optional):23777}{See vitals history (optional):1}   Physical Exam Vitals reviewed.  Constitutional:      General: She is awake.     Appearance: Normal appearance. She is well-developed and well-groomed.  HENT:     Head: Normocephalic and atraumatic.     Right Ear: Hearing, tympanic membrane and ear canal normal.  Left Ear: Hearing, tympanic membrane and ear canal normal.     Mouth/Throat:     Lips: Pink.     Mouth: Mucous membranes are moist.     Pharynx: Oropharynx is clear. Uvula midline. No pharyngeal swelling or posterior oropharyngeal erythema.  Cardiovascular:     Rate and Rhythm: Normal rate and regular rhythm.     Pulses: Normal pulses.          Radial pulses are 2+ on the right side.     Heart sounds: Normal heart sounds. No murmur heard.    No friction rub. No gallop.  Pulmonary:     Effort: Pulmonary effort is normal.     Breath sounds: Normal breath sounds. No decreased air movement. No  decreased breath sounds, wheezing, rhonchi or rales.  Musculoskeletal:     Right lower leg: No edema.     Left lower leg: No edema.  Skin:    General: Skin is warm and dry.  Neurological:     General: No focal deficit present.     Mental Status: She is alert and oriented to person, place, and time.     GCS: GCS eye subscore is 4. GCS verbal subscore is 5. GCS motor subscore is 6.     Cranial Nerves: No cranial nerve deficit, dysarthria or facial asymmetry.     Motor: No weakness, tremor, atrophy or abnormal muscle tone.     Gait: Gait is intact.  Psychiatric:        Behavior: Behavior is cooperative.     EKG performed today in office EKG demonstrates normal sinus rhythm, rate of ~65 bpm No previous EKG available in chart for comparison     No results found for any visits on 11/07/22.  Assessment & Plan      No follow-ups on file.      Problem List Items Addressed This Visit   None Visit Diagnoses     Dizziness    -  Primary   Relevant Orders   COMPLETE METABOLIC PANEL WITH GFR   CBC w/Diff/Platelet   Palpitations       Relevant Orders   COMPLETE METABOLIC PANEL WITH GFR   CBC w/Diff/Platelet   EKG 12-Lead        No follow-ups on file.   I, Tanya Santoni E Susano Cleckler, PA-C, have reviewed all documentation for this visit. The documentation on 11/07/22 for the exam, diagnosis, procedures, and orders are all accurate and complete.   Tanya Sawyer, MHS, PA-C [facility]

## 2022-11-08 LAB — COMPLETE METABOLIC PANEL WITH GFR
AG Ratio: 1.6 (calc) (ref 1.0–2.5)
ALT: 22 U/L (ref 6–29)
AST: 21 U/L (ref 10–35)
Albumin: 4.4 g/dL (ref 3.6–5.1)
Alkaline phosphatase (APISO): 92 U/L (ref 37–153)
BUN: 23 mg/dL (ref 7–25)
CO2: 26 mmol/L (ref 20–32)
Calcium: 9.8 mg/dL (ref 8.6–10.4)
Chloride: 103 mmol/L (ref 98–110)
Creat: 0.63 mg/dL (ref 0.50–1.03)
Globulin: 2.8 g/dL (ref 1.9–3.7)
Glucose, Bld: 87 mg/dL (ref 65–99)
Potassium: 4.2 mmol/L (ref 3.5–5.3)
Sodium: 139 mmol/L (ref 135–146)
Total Bilirubin: 0.3 mg/dL (ref 0.2–1.2)
Total Protein: 7.2 g/dL (ref 6.1–8.1)
eGFR: 106 mL/min/{1.73_m2} (ref >=60)

## 2022-11-08 LAB — CBC WITH DIFFERENTIAL/PLATELET
Absolute Monocytes: 422 {cells}/uL (ref 200–950)
Basophils Absolute: 60 {cells}/uL (ref 0–200)
Basophils Relative: 0.9 %
Eosinophils Absolute: 54 {cells}/uL (ref 15–500)
Eosinophils Relative: 0.8 %
HCT: 40.1 % (ref 35.0–45.0)
Hemoglobin: 13.4 g/dL (ref 11.7–15.5)
Lymphs Abs: 2620 {cells}/uL (ref 850–3900)
MCH: 31.8 pg (ref 27.0–33.0)
MCHC: 33.4 g/dL (ref 32.0–36.0)
MCV: 95 fL (ref 80.0–100.0)
MPV: 11.2 fL (ref 7.5–12.5)
Monocytes Relative: 6.3 %
Neutro Abs: 3544 {cells}/uL (ref 1500–7800)
Neutrophils Relative %: 52.9 %
Platelets: 338 10*3/uL (ref 140–400)
RBC: 4.22 10*6/uL (ref 3.80–5.10)
RDW: 11.9 % (ref 11.0–15.0)
Total Lymphocyte: 39.1 %
WBC: 6.7 10*3/uL (ref 3.8–10.8)

## 2022-11-09 NOTE — Progress Notes (Signed)
Labs are normal/stable.

## 2023-01-31 ENCOUNTER — Ambulatory Visit: Payer: BC Managed Care – PPO | Admitting: Physician Assistant

## 2023-08-29 ENCOUNTER — Other Ambulatory Visit: Payer: Self-pay | Admitting: Family Medicine

## 2023-08-29 DIAGNOSIS — Z1231 Encounter for screening mammogram for malignant neoplasm of breast: Secondary | ICD-10-CM

## 2023-09-29 ENCOUNTER — Ambulatory Visit
Admission: RE | Admit: 2023-09-29 | Discharge: 2023-09-29 | Disposition: A | Discharge status: Home / Self Care | Source: Ambulatory Visit | Attending: Family Medicine | Admitting: Family Medicine

## 2023-09-29 DIAGNOSIS — Z1231 Encounter for screening mammogram for malignant neoplasm of breast: Secondary | ICD-10-CM | POA: Insufficient documentation
